# Patient Record
Sex: Male | Born: 1945 | Race: White | Hispanic: No | Marital: Married | Smoking: Never smoker
Health system: Southern US, Community
[De-identification: ages and names within clinical notes are randomized; demographics above are authoritative.]

## PROBLEM LIST (undated history)

## (undated) DIAGNOSIS — I10 Essential (primary) hypertension: Secondary | ICD-10-CM

## (undated) DIAGNOSIS — E119 Type 2 diabetes mellitus without complications: Secondary | ICD-10-CM

## (undated) DIAGNOSIS — E7800 Pure hypercholesterolemia, unspecified: Secondary | ICD-10-CM

## (undated) HISTORY — PX: SHOULDER SURGERY: SHX246

---

## 2019-09-07 ENCOUNTER — Encounter (HOSPITAL_BASED_OUTPATIENT_CLINIC_OR_DEPARTMENT_OTHER): Payer: Self-pay | Admitting: Emergency Medicine

## 2019-09-07 ENCOUNTER — Emergency Department (HOSPITAL_BASED_OUTPATIENT_CLINIC_OR_DEPARTMENT_OTHER): Payer: Medicare Other

## 2019-09-07 ENCOUNTER — Inpatient Hospital Stay (HOSPITAL_BASED_OUTPATIENT_CLINIC_OR_DEPARTMENT_OTHER)
Admission: EM | Admit: 2019-09-07 | Discharge: 2019-09-11 | DRG: 872 | Disposition: A | Discharge status: Home / Self Care | Payer: Medicare Other | Attending: Family Medicine | Admitting: Family Medicine

## 2019-09-07 ENCOUNTER — Other Ambulatory Visit: Payer: Self-pay

## 2019-09-07 DIAGNOSIS — I129 Hypertensive chronic kidney disease with stage 1 through stage 4 chronic kidney disease, or unspecified chronic kidney disease: Secondary | ICD-10-CM | POA: Diagnosis present

## 2019-09-07 DIAGNOSIS — K760 Fatty (change of) liver, not elsewhere classified: Secondary | ICD-10-CM | POA: Diagnosis present

## 2019-09-07 DIAGNOSIS — N179 Acute kidney failure, unspecified: Secondary | ICD-10-CM | POA: Diagnosis present

## 2019-09-07 DIAGNOSIS — N1831 Chronic kidney disease, stage 3a: Secondary | ICD-10-CM | POA: Diagnosis present

## 2019-09-07 DIAGNOSIS — A419 Sepsis, unspecified organism: Secondary | ICD-10-CM | POA: Diagnosis not present

## 2019-09-07 DIAGNOSIS — G2581 Restless legs syndrome: Secondary | ICD-10-CM | POA: Diagnosis present

## 2019-09-07 DIAGNOSIS — E8770 Fluid overload, unspecified: Secondary | ICD-10-CM | POA: Diagnosis present

## 2019-09-07 DIAGNOSIS — N401 Enlarged prostate with lower urinary tract symptoms: Secondary | ICD-10-CM | POA: Diagnosis present

## 2019-09-07 DIAGNOSIS — E1122 Type 2 diabetes mellitus with diabetic chronic kidney disease: Secondary | ICD-10-CM | POA: Diagnosis present

## 2019-09-07 DIAGNOSIS — Z6837 Body mass index (BMI) 37.0-37.9, adult: Secondary | ICD-10-CM

## 2019-09-07 DIAGNOSIS — R000 Tachycardia, unspecified: Secondary | ICD-10-CM | POA: Diagnosis present

## 2019-09-07 DIAGNOSIS — I350 Nonrheumatic aortic (valve) stenosis: Secondary | ICD-10-CM | POA: Diagnosis present

## 2019-09-07 DIAGNOSIS — E785 Hyperlipidemia, unspecified: Secondary | ICD-10-CM | POA: Diagnosis present

## 2019-09-07 DIAGNOSIS — R509 Fever, unspecified: Secondary | ICD-10-CM | POA: Diagnosis present

## 2019-09-07 DIAGNOSIS — E662 Morbid (severe) obesity with alveolar hypoventilation: Secondary | ICD-10-CM | POA: Diagnosis present

## 2019-09-07 DIAGNOSIS — K7460 Unspecified cirrhosis of liver: Secondary | ICD-10-CM

## 2019-09-07 DIAGNOSIS — R6520 Severe sepsis without septic shock: Secondary | ICD-10-CM

## 2019-09-07 DIAGNOSIS — N138 Other obstructive and reflux uropathy: Secondary | ICD-10-CM | POA: Diagnosis present

## 2019-09-07 DIAGNOSIS — N170 Acute kidney failure with tubular necrosis: Secondary | ICD-10-CM | POA: Diagnosis not present

## 2019-09-07 DIAGNOSIS — R6889 Other general symptoms and signs: Secondary | ICD-10-CM

## 2019-09-07 DIAGNOSIS — R6510 Systemic inflammatory response syndrome (SIRS) of non-infectious origin without acute organ dysfunction: Secondary | ICD-10-CM

## 2019-09-07 DIAGNOSIS — R0603 Acute respiratory distress: Secondary | ICD-10-CM

## 2019-09-07 DIAGNOSIS — Z20822 Contact with and (suspected) exposure to covid-19: Secondary | ICD-10-CM | POA: Diagnosis present

## 2019-09-07 HISTORY — DX: Pure hypercholesterolemia, unspecified: E78.00

## 2019-09-07 HISTORY — DX: Essential (primary) hypertension: I10

## 2019-09-07 HISTORY — DX: Type 2 diabetes mellitus without complications: E11.9

## 2019-09-07 LAB — URINALYSIS, ROUTINE W REFLEX MICROSCOPIC
Bilirubin Urine: NEGATIVE
Glucose, UA: NEGATIVE mg/dL
Hgb urine dipstick: NEGATIVE
Ketones, ur: NEGATIVE mg/dL
Leukocytes,Ua: NEGATIVE
Nitrite: NEGATIVE
Protein, ur: NEGATIVE mg/dL
Specific Gravity, Urine: <1.005 — ABNORMAL LOW (ref 1.005–1.030)
pH: 5.5 (ref 5.0–8.0)

## 2019-09-07 LAB — CBC
HCT: 39.2 % (ref 39.0–52.0)
Hemoglobin: 13.3 g/dL (ref 13.0–17.0)
MCH: 31.1 pg (ref 26.0–34.0)
MCHC: 33.9 g/dL (ref 30.0–36.0)
MCV: 91.6 fL (ref 80.0–100.0)
Platelets: 204 10*3/uL (ref 150–400)
RBC: 4.28 MIL/uL (ref 4.22–5.81)
RDW: 13.3 % (ref 11.5–15.5)
WBC: 20.6 10*3/uL — ABNORMAL HIGH (ref 4.0–10.5)
nRBC: 0 % (ref 0.0–0.2)

## 2019-09-07 LAB — CBC WITH DIFFERENTIAL/PLATELET
Abs Immature Granulocytes: 0.08 10*3/uL — ABNORMAL HIGH (ref 0.00–0.07)
Basophils Absolute: 0 10*3/uL (ref 0.0–0.1)
Basophils Relative: 0 %
Eosinophils Absolute: 0.2 10*3/uL (ref 0.0–0.5)
Eosinophils Relative: 1 %
HCT: 41.5 % (ref 39.0–52.0)
Hemoglobin: 13.8 g/dL (ref 13.0–17.0)
Immature Granulocytes: 1 %
Lymphocytes Relative: 10 %
Lymphs Abs: 1.1 10*3/uL (ref 0.7–4.0)
MCH: 31 pg (ref 26.0–34.0)
MCHC: 33.3 g/dL (ref 30.0–36.0)
MCV: 93.3 fL (ref 80.0–100.0)
Monocytes Absolute: 0.5 10*3/uL (ref 0.1–1.0)
Monocytes Relative: 5 %
Neutro Abs: 9.3 10*3/uL — ABNORMAL HIGH (ref 1.7–7.7)
Neutrophils Relative %: 83 %
Platelets: 204 10*3/uL (ref 150–400)
RBC: 4.45 MIL/uL (ref 4.22–5.81)
RDW: 12.9 % (ref 11.5–15.5)
WBC: 11.2 10*3/uL — ABNORMAL HIGH (ref 4.0–10.5)
nRBC: 0 % (ref 0.0–0.2)

## 2019-09-07 LAB — CREATININE, SERUM
Creatinine, Ser: 1.53 mg/dL — ABNORMAL HIGH (ref 0.61–1.24)
GFR calc Af Amer: 52 mL/min — ABNORMAL LOW (ref >60)
GFR calc non Af Amer: 45 mL/min — ABNORMAL LOW (ref >60)

## 2019-09-07 LAB — GLUCOSE, CAPILLARY
Glucose-Capillary: 124 mg/dL — ABNORMAL HIGH (ref 70–99)
Glucose-Capillary: 120 mg/dL — ABNORMAL HIGH (ref 70–99)

## 2019-09-07 LAB — PROTIME-INR
INR: 1.1 (ref 0.8–1.2)
Prothrombin Time: 13.2 seconds (ref 11.4–15.2)

## 2019-09-07 LAB — COMPREHENSIVE METABOLIC PANEL
ALT: 28 U/L (ref 0–44)
AST: 26 U/L (ref 15–41)
Albumin: 4 g/dL (ref 3.5–5.0)
Alkaline Phosphatase: 53 U/L (ref 38–126)
Anion gap: 10 (ref 5–15)
BUN: 30 mg/dL — ABNORMAL HIGH (ref 8–23)
CO2: 25 mmol/L (ref 22–32)
Calcium: 9.2 mg/dL (ref 8.9–10.3)
Chloride: 104 mmol/L (ref 98–111)
Creatinine, Ser: 1.67 mg/dL — ABNORMAL HIGH (ref 0.61–1.24)
GFR calc Af Amer: 47 mL/min — ABNORMAL LOW (ref >60)
GFR calc non Af Amer: 41 mL/min — ABNORMAL LOW (ref >60)
Glucose, Bld: 78 mg/dL (ref 70–99)
Potassium: 3.6 mmol/L (ref 3.5–5.1)
Sodium: 139 mmol/L (ref 135–145)
Total Bilirubin: 0.7 mg/dL (ref 0.3–1.2)
Total Protein: 7.5 g/dL (ref 6.5–8.1)

## 2019-09-07 LAB — LACTIC ACID, PLASMA: Lactic Acid, Venous: 1.2 mmol/L (ref 0.5–1.9)

## 2019-09-07 LAB — APTT: aPTT: 21 seconds — ABNORMAL LOW (ref 24–36)

## 2019-09-07 LAB — SARS CORONAVIRUS 2 BY RT PCR (HOSPITAL ORDER, PERFORMED IN ~~LOC~~ HOSPITAL LAB): SARS Coronavirus 2: NEGATIVE

## 2019-09-07 MED ORDER — ENOXAPARIN SODIUM 40 MG/0.4ML ~~LOC~~ SOLN
40.0000 mg | SUBCUTANEOUS | Status: DC
  Administered 2019-09-08 – 2019-09-11 (×4): 40 mg via SUBCUTANEOUS
  Filled 2019-09-07 (×4): qty 0.4

## 2019-09-07 MED ORDER — VANCOMYCIN HCL 1250 MG/250ML IV SOLN
1250.0000 mg | INTRAVENOUS | Status: DC
  Administered 2019-09-08 – 2019-09-09 (×2): 1250 mg via INTRAVENOUS
  Filled 2019-09-07 (×3): qty 250

## 2019-09-07 MED ORDER — SODIUM CHLORIDE 0.9 % IV SOLN
2.0000 g | Freq: Two times a day (BID) | INTRAVENOUS | Status: DC
  Administered 2019-09-07 – 2019-09-10 (×6): 2 g via INTRAVENOUS
  Filled 2019-09-07 (×6): qty 2

## 2019-09-07 MED ORDER — METRONIDAZOLE IN NACL 5-0.79 MG/ML-% IV SOLN
500.0000 mg | Freq: Three times a day (TID) | INTRAVENOUS | Status: DC
  Administered 2019-09-07 – 2019-09-10 (×9): 500 mg via INTRAVENOUS
  Filled 2019-09-07 (×9): qty 100

## 2019-09-07 MED ORDER — VANCOMYCIN HCL IN DEXTROSE 1-5 GM/200ML-% IV SOLN
INTRAVENOUS | Status: AC
  Administered 2019-09-07: 1000 mg via INTRAVENOUS
  Filled 2019-09-07: qty 400

## 2019-09-07 MED ORDER — ACETAMINOPHEN 325 MG PO TABS
650.0000 mg | ORAL_TABLET | Freq: Once | ORAL | Status: AC
  Administered 2019-09-07: 650 mg via ORAL
  Filled 2019-09-07: qty 2

## 2019-09-07 MED ORDER — SODIUM CHLORIDE 0.9 % IV BOLUS (SEPSIS)
1000.0000 mL | Freq: Once | INTRAVENOUS | Status: AC
  Administered 2019-09-07: 1000 mL via INTRAVENOUS

## 2019-09-07 MED ORDER — SODIUM CHLORIDE 0.9 % IV SOLN: INTRAVENOUS | Status: DC

## 2019-09-07 MED ORDER — VANCOMYCIN HCL IN DEXTROSE 1-5 GM/200ML-% IV SOLN: 1000.0000 mg | Freq: Once | INTRAVENOUS | Status: AC

## 2019-09-07 MED ORDER — MELATONIN 3 MG PO TABS
6.0000 mg | ORAL_TABLET | Freq: Every evening | ORAL | Status: DC | PRN
  Administered 2019-09-07 – 2019-09-10 (×4): 6 mg via ORAL
  Filled 2019-09-07 (×4): qty 2

## 2019-09-07 MED ORDER — INSULIN ASPART PROT & ASPART (70-30 MIX) 100 UNIT/ML ~~LOC~~ SUSP
35.0000 [IU] | Freq: Two times a day (BID) | SUBCUTANEOUS | Status: DC
  Administered 2019-09-08 – 2019-09-10 (×5): 35 [IU] via SUBCUTANEOUS
  Filled 2019-09-07: qty 10

## 2019-09-07 MED ORDER — VANCOMYCIN HCL 2000 MG/400ML IV SOLN
2000.0000 mg | Freq: Once | INTRAVENOUS | Status: DC
  Filled 2019-09-07: qty 400

## 2019-09-07 MED ORDER — SODIUM CHLORIDE 0.9 % IV SOLN: 2.0000 g | Freq: Once | INTRAVENOUS | Status: DC

## 2019-09-07 MED ORDER — VANCOMYCIN HCL IN DEXTROSE 1-5 GM/200ML-% IV SOLN: 1000.0000 mg | Freq: Once | INTRAVENOUS | Status: DC

## 2019-09-07 MED ORDER — SODIUM CHLORIDE 0.9 % IV BOLUS (SEPSIS)
200.0000 mL | Freq: Once | INTRAVENOUS | Status: AC
  Administered 2019-09-07: 200 mL via INTRAVENOUS

## 2019-09-07 MED ORDER — ENOXAPARIN SODIUM 30 MG/0.3ML ~~LOC~~ SOLN
30.0000 mg | SUBCUTANEOUS | Status: DC
  Administered 2019-09-07: 30 mg via SUBCUTANEOUS
  Filled 2019-09-07: qty 0.3

## 2019-09-07 MED ORDER — VANCOMYCIN HCL IN DEXTROSE 1-5 GM/200ML-% IV SOLN
1000.0000 mg | Freq: Once | INTRAVENOUS | Status: DC
Start: 2019-09-07 — End: 2019-09-07

## 2019-09-07 MED ORDER — VANCOMYCIN HCL IN DEXTROSE 1-5 GM/200ML-% IV SOLN
1000.0000 mg | Freq: Once | INTRAVENOUS | Status: AC
  Administered 2019-09-07: 1000 mg via INTRAVENOUS

## 2019-09-07 MED ORDER — MELATONIN 3 MG PO TABS: 6.0000 mg | ORAL_TABLET | Freq: Every day | ORAL | Status: DC

## 2019-09-07 MED ORDER — METRONIDAZOLE IN NACL 5-0.79 MG/ML-% IV SOLN
500.0000 mg | Freq: Once | INTRAVENOUS | Status: AC
  Administered 2019-09-07: 500 mg via INTRAVENOUS
  Filled 2019-09-07: qty 100

## 2019-09-07 MED ORDER — SODIUM CHLORIDE 0.9 % IV SOLN
2.0000 g | Freq: Once | INTRAVENOUS | Status: AC
  Administered 2019-09-07: 2 g via INTRAVENOUS
  Filled 2019-09-07: qty 2

## 2019-09-07 MED ORDER — ROPINIROLE HCL 1 MG PO TABS
6.0000 mg | ORAL_TABLET | Freq: Every day | ORAL | Status: DC
  Administered 2019-09-07 – 2019-09-10 (×4): 6 mg via ORAL
  Filled 2019-09-07 (×5): qty 6

## 2019-09-07 MED ORDER — IOHEXOL 300 MG/ML  SOLN
100.0000 mL | Freq: Once | INTRAMUSCULAR | Status: AC | PRN
  Administered 2019-09-07: 80 mL via INTRAVENOUS

## 2019-09-07 NOTE — Progress Notes (Signed)
Pt. placed on CPAP @ this time, tolerating well. 

## 2019-09-07 NOTE — ED Provider Notes (Signed)
Home splints [facility] EMERGENCY DEPARTMENT Provider Note   CSN: [id number] Arrival date & time: 09/07/19  2694     History Chief Complaint  Patient presents with   Fever    Ronald Cooley is a 74 y.o. male.  He is complaining of acute onset of chills starting around 3 AM this morning.  Felt cold.  Daughter said that her mother thought the patient was a little confused.  Patient denies.  He has no headache sore throat cough vomiting diarrhea or urinary symptoms.  Does endorse nausea.  No sick contacts or recent travel.  No new rashes or wounds.  No swollen joints.  Had his Covid vaccine.  Daughter states that he was weak and she had to assist with ambulation.  The history is provided by the patient and a relative.  Fever Max temp prior to arrival:  102.6 Temp source:  Oral Severity:  Moderate Onset quality:  Sudden Timing:  Constant Progression:  Unchanged Chronicity:  New Relieved by:  None tried Worsened by:  Nothing Ineffective treatments:  None tried Associated symptoms: chills, confusion and nausea   Associated symptoms: no chest pain, no cough, no diarrhea, no dysuria, no headaches, no myalgias, no rash, no rhinorrhea, no sore throat and no vomiting   Risk factors: no hx of cancer, no recent sickness, no recent travel and no sick contacts        Past Medical History:  Diagnosis Date   Diabetes mellitus without complication (HCC)    High cholesterol    Hypertension     There are no problems to display for this patient.   Past Surgical History:  Procedure Laterality Date   SHOULDER SURGERY         No family history on file.  Social History   Tobacco Use   Smoking status: Never Smoker   Smokeless tobacco: Never Used  Substance Use Topics   Alcohol use: Never   Drug use: Never    Home Medications Prior to Admission medications   Not on File    Allergies    Patient has no known allergies.  Review of Systems   Review of  Systems  Constitutional: Positive for chills and fever.  HENT: Negative for rhinorrhea and sore throat.   Eyes: Negative for visual disturbance.  Respiratory: Negative for cough and shortness of breath.   Cardiovascular: Negative for chest pain.  Gastrointestinal: Positive for nausea. Negative for abdominal pain, diarrhea and vomiting.  Genitourinary: Negative for dysuria.  Musculoskeletal: Positive for gait problem. Negative for myalgias.  Skin: Negative for rash.  Neurological: Negative for headaches.  Psychiatric/Behavioral: Positive for confusion.    Physical Exam Updated Vital Signs BP 123/71 (BP Location: Right Arm)    Pulse (!) 125    Temp (!) 102.6 F (39.2 C) (Oral)    Resp 20    Ht 5\' 8"  (1.727 m)    Wt 111.1 kg    SpO2 93%    BMI 37.25 kg/m   Physical Exam Vitals and nursing note reviewed.  Constitutional:      Appearance: Normal appearance. He is well-developed.  HENT:     Head: Normocephalic and atraumatic.  Eyes:     Conjunctiva/sclera: Conjunctivae normal.  Cardiovascular:     Rate and Rhythm: Regular rhythm. Tachycardia present.     Heart sounds: Murmur heard.   Pulmonary:     Effort: Pulmonary effort is normal. No respiratory distress.     Breath sounds: Normal breath sounds.  Abdominal:  Palpations: Abdomen is soft.     Tenderness: There is no abdominal tenderness.  Musculoskeletal:        General: No signs of injury. Normal range of motion.     Cervical back: Neck supple.     Right lower leg: Edema present.     Left lower leg: Edema present.  Skin:    General: Skin is warm and dry.     Capillary Refill: Capillary refill takes less than 2 seconds.  Neurological:     General: No focal deficit present.     Mental Status: He is alert and oriented to person, place, and time.     ED Results / Procedures / Treatments   Labs (all labs ordered are listed, but only abnormal results are displayed) Labs Reviewed  COMPREHENSIVE METABOLIC PANEL -  Abnormal; Notable for the following components:      Result Value   BUN 30 (*)    Creatinine, Ser 1.67 (*)    GFR calc non Af Amer 41 (*)    GFR calc Af Amer 47 (*)    All other components within normal limits  CBC WITH DIFFERENTIAL/PLATELET - Abnormal; Notable for the following components:   WBC 11.2 (*)    Neutro Abs 9.3 (*)    Abs Immature Granulocytes 0.08 (*)    All other components within normal limits  APTT - Abnormal; Notable for the following components:   aPTT 21 (*)    All other components within normal limits  URINALYSIS, ROUTINE W REFLEX MICROSCOPIC - Abnormal; Notable for the following components:   Specific Gravity, Urine <1.005 (*)    All other components within normal limits  CBC - Abnormal; Notable for the following components:   WBC 20.6 (*)    All other components within normal limits  CREATININE, SERUM - Abnormal; Notable for the following components:   Creatinine, Ser 1.53 (*)    GFR calc non Af Amer 45 (*)    GFR calc Af Amer 52 (*)    All other components within normal limits  GLUCOSE, CAPILLARY - Abnormal; Notable for the following components:   Glucose-Capillary 124 (*)    All other components within normal limits  SARS CORONAVIRUS 2 BY RT PCR (HOSPITAL ORDER, PERFORMED IN [facility])  CULTURE, BLOOD (ROUTINE X 2)  CULTURE, BLOOD (ROUTINE X 2)  URINE CULTURE  LACTIC ACID, PLASMA  PROTIME-INR  LACTIC ACID, PLASMA  PROTIME-INR  CORTISOL-AM, BLOOD  PROCALCITONIN  COMPREHENSIVE METABOLIC PANEL  CBC    EKG EKG Interpretation  Date/Time:  Wednesday September 07 2019 07:51:40 EDT Ventricular Rate:  120 PR Interval:    QRS Duration: 95 QT Interval:  300 QTC Calculation: 424 R Axis:   5 Text Interpretation: Sinus tachycardia Borderline ST elevation, anterior leads No old tracing to compare Confirmed by Meridee ScoreButler, Marya Lowden [phone] on 09/07/2019 7:52:52 AM   Radiology CT Chest W Contrast  Result Date: 09/07/2019 CLINICAL DATA:  Abdominal  abscess or infection suspected. Fever and weakness EXAM: CT CHEST, ABDOMEN, AND PELVIS WITH CONTRAST TECHNIQUE: Multidetector CT imaging of the chest, abdomen and pelvis was performed following the standard protocol during bolus administration of intravenous contrast. CONTRAST:  80mL OMNIPAQUE IOHEXOL 300 MG/ML  SOLN COMPARISON:  None. FINDINGS: CT CHEST FINDINGS Cardiovascular: Normal heart size. No pericardial effusion. Aortic and coronary atherosclerosis. Aortic valvular calcification. No acute vascular finding. Mediastinum/Nodes: Negative for adenopathy. Lungs/Pleura: There is no edema, consolidation, effusion, or pneumothorax. Musculoskeletal: Extensive disc degeneration. There are levels of  endplate irregularity that is multilevel and degenerative appearing. No paravertebral inflammatory soft tissue findings. Postoperative right shoulder with rotator cuff atrophy. CT ABDOMEN PELVIS FINDINGS Hepatobiliary: No focal liver abnormality.No evidence of biliary obstruction or stone. Pancreas: Unremarkable. Spleen: Unremarkable. Adrenals/Urinary Tract: Negative adrenals. No hydronephrosis or stone. Unremarkable bladder. Stomach/Bowel:  No obstruction. No appendicitis. Vascular/Lymphatic: No acute vascular abnormality. Extensive atherosclerotic calcification. No mass or adenopathy. Reproductive:No pathologic findings. Other: No ascites or pneumoperitoneum. Musculoskeletal: No acute abnormalities. Advanced lumbar spine degeneration with L4-5 anterolisthesis and high-grade canal/right foraminal stenosis. No evidence of bony infection. IMPRESSION: 1. No explanation for fever. No acute finding in the chest or abdomen. 2.  Aortic Atherosclerosis (ICD10-I70.0). Electronically Signed   By: Marnee Spring M.D.   On: 09/07/2019 09:25   CT Abdomen Pelvis W Contrast  Result Date: 09/07/2019 CLINICAL DATA:  Abdominal abscess or infection suspected. Fever and weakness EXAM: CT CHEST, ABDOMEN, AND PELVIS WITH CONTRAST  TECHNIQUE: Multidetector CT imaging of the chest, abdomen and pelvis was performed following the standard protocol during bolus administration of intravenous contrast. CONTRAST:  80mL OMNIPAQUE IOHEXOL 300 MG/ML  SOLN COMPARISON:  None. FINDINGS: CT CHEST FINDINGS Cardiovascular: Normal heart size. No pericardial effusion. Aortic and coronary atherosclerosis. Aortic valvular calcification. No acute vascular finding. Mediastinum/Nodes: Negative for adenopathy. Lungs/Pleura: There is no edema, consolidation, effusion, or pneumothorax. Musculoskeletal: Extensive disc degeneration. There are levels of endplate irregularity that is multilevel and degenerative appearing. No paravertebral inflammatory soft tissue findings. Postoperative right shoulder with rotator cuff atrophy. CT ABDOMEN PELVIS FINDINGS Hepatobiliary: No focal liver abnormality.No evidence of biliary obstruction or stone. Pancreas: Unremarkable. Spleen: Unremarkable. Adrenals/Urinary Tract: Negative adrenals. No hydronephrosis or stone. Unremarkable bladder. Stomach/Bowel:  No obstruction. No appendicitis. Vascular/Lymphatic: No acute vascular abnormality. Extensive atherosclerotic calcification. No mass or adenopathy. Reproductive:No pathologic findings. Other: No ascites or pneumoperitoneum. Musculoskeletal: No acute abnormalities. Advanced lumbar spine degeneration with L4-5 anterolisthesis and high-grade canal/right foraminal stenosis. No evidence of bony infection. IMPRESSION: 1. No explanation for fever. No acute finding in the chest or abdomen. 2.  Aortic Atherosclerosis (ICD10-I70.0). Electronically Signed   By: Marnee Spring M.D.   On: 09/07/2019 09:25   DG Chest Port 1 View  Result Date: 09/07/2019 CLINICAL DATA:  Fever and rigors EXAM: PORTABLE CHEST 1 VIEW COMPARISON:  None. FINDINGS: Normal heart size and mediastinal contours. Low volume chest without acute infiltrate or edema. No effusion or pneumothorax. No acute osseous findings.  IMPRESSION: No visible pneumonia. Electronically Signed   By: Marnee Spring M.D.   On: 09/07/2019 07:48    Procedures .Critical Care Performed by: Terrilee Files, MD Authorized by: Terrilee Files, MD   Critical care provider statement:    Critical care time (minutes):  45   Critical care time was exclusive of:  Separately billable procedures and treating other patients   Critical care was necessary to treat or prevent imminent or life-threatening deterioration of the following conditions:  Sepsis   Critical care was time spent personally by me on the following activities:  Discussions with consultants, evaluation of patient's response to treatment, examination of patient, ordering and performing treatments and interventions, ordering and review of laboratory studies, ordering and review of radiographic studies, pulse oximetry, re-evaluation of patient's condition, obtaining history from patient or surrogate, review of old charts and development of treatment plan with patient or surrogate   I assumed direction of critical care for this patient from another provider in my specialty: no     (including critical care time)  Medications Ordered in ED Medications  ceFEPIme (MAXIPIME) 2 g in sodium chloride 0.9 % 100 mL IVPB (has no administration in time range)  vancomycin (VANCOREADY) IVPB 1250 mg/250 mL (has no administration in time range)  0.9 %  sodium chloride infusion ( Intravenous New Bag/Given 09/07/19 1928)  enoxaparin (LOVENOX) injection 30 mg (has no administration in time range)  metroNIDAZOLE (FLAGYL) IVPB 500 mg (500 mg Intravenous New Bag/Given 09/07/19 1829)  insulin aspart protamine- aspart (NOVOLOG MIX 70/30) injection 35 Units (has no administration in time range)  sodium chloride 0.9 % bolus 1,000 mL (0 mLs Intravenous Stopped 09/07/19 0806)    And  sodium chloride 0.9 % bolus 1,000 mL (0 mLs Intravenous Stopped 09/07/19 0829)    And  sodium chloride 0.9 % bolus 200 mL  (0 mLs Intravenous Stopped 09/07/19 0902)  acetaminophen (TYLENOL) tablet 650 mg (650 mg Oral Given 09/07/19 0733)  iohexol (OMNIPAQUE) 300 MG/ML solution 100 mL (80 mLs Intravenous Contrast Given 09/07/19 0837)  ceFEPIme (MAXIPIME) 2 g in sodium chloride 0.9 % 100 mL IVPB (0 g Intravenous Stopped 09/07/19 1009)  metroNIDAZOLE (FLAGYL) IVPB 500 mg (0 mg Intravenous Stopped 09/07/19 1047)  vancomycin (VANCOCIN) IVPB 1000 mg/200 mL premix (0 mg Intravenous Stopped 09/07/19 1132)    And  vancomycin (VANCOCIN) IVPB 1000 mg/200 mL premix (0 mg Intravenous Stopped 09/07/19 1128)    ED Course  I have reviewed the triage vital signs and the nursing notes.  Pertinent labs & imaging results that were available during my care of the patient were reviewed by me and considered in my medical decision making (see chart for details).  Clinical Course as of Sep 07 1934  Wed Sep 07, 2019  7846 Chest x-ray interpreted by me as no gross infiltrates.   [MB]  0802 Initial lactate not elevated.   [MB]  0944 No source identified.  Empiric antibiotics ordered.  Discussed with [facility] hospitalist for admission.  Patient and daughter in agreement for admission for further work-up of fever.   [MB]    Clinical Course User Index [MB] Terrilee Files, MD   MDM Rules/Calculators/A&P                         Holdyn Poyser was evaluated in Emergency Department on 09/07/2019 for the symptoms described in the history of present illness. He was evaluated in the context of the global COVID-19 pandemic, which necessitated consideration that the patient might be at risk for infection with the SARS-CoV-2 virus that causes COVID-19. Institutional protocols and algorithms that pertain to the evaluation of patients at risk for COVID-19 are in a state of rapid change based on information released by regulatory bodies including the CDC and federal and state organizations. These policies and algorithms were followed during the patient's  care in the ED.  This patient complains of shaking chills; this involves an extensive number of treatment Options and is a complaint that carries with it a high risk of complications and Morbidity. The differential includes sepsis, Sirs, pneumonia, UTI, intra-abdominal abscess, covid.   I ordered, reviewed and interpreted labs, which included CBC with elevated white count, normal hemoglobin, chemistries with elevated creatinine unclear baseline, Covid testing negative, urinalysis negative, lactate not elevated at 1.2 I ordered medication IV fluids for sepsis, empiric antibiotics Tylenol I ordered imaging studies which included chest x-ray, CT chest and abdomen and pelvis and I independently    visualized and interpreted imaging which showed no acute findings  Additional history obtained from patient's daughter Previous records obtained and reviewed in epic, no recent admissions I consulted [facility] hospitalist and discussed lab and imaging findings  Critical Interventions: Identification of the management of SIRS/sepsis.  Patient will be admitted to the hospital for continued antibiotics monitoring of blood cultures.  After the interventions stated above, I reevaluated the patient and found patient to be hemodynamically stable, has defervesced a little bit but is still tachycardic.  Blood pressures remained stable.  Will need to be transferred to [facility] long campus for admission.   Final Clinical Impression(s) / ED Diagnoses Final diagnoses:  Fever, unspecified  Rigors  SIRS (systemic inflammatory response syndrome) (HCC)    Rx / DC Orders ED Discharge Orders    None       Terrilee Files, MD 09/07/19 1945

## 2019-09-07 NOTE — H&P (Signed)
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 No ill contacts but is not sure if the place he was at had any contamination No exposures to [employer] me that he has trouble with his urinary stream since today but has always had some issues also tells me that he has occasional sciatic pain but no other swollen joints or swellings [facility]-found to have high fever 103-started on empiric antibiotic coverage given a bolus of fluids and discussed admission  Review of Systems:    Pertinent +'s: + Tinnitus, + weak urinary stream, + some nausea this morning Pertinent -"s: Cough, cold, ear pain, sore throat, neck stiffness, abdominal pain, joint swelling, vomit, dark stool, tarry stool, unilateral weakness, strokelike symptoms, seizure, rash, wound or sore,  ED Course: Broad-spectrum antibiotics given, bolus of fluids, Tylenol given, kept n.p.o.   Past Medical History:  Diagnosis Date  . Diabetes mellitus without complication (HCC)   . High cholesterol   . Hypertension    Past Surgical History:  Procedure Laterality Date  . SHOULDER SURGERY      reports that he has never smoked. He has never used smokeless tobacco. He reports that he does not drink alcohol and does not use drugs. 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 Prior to Admission medications   Not on File  Father and mother passed away of heart failure  Physical Exam:  Vitals:   09/07/19 1500 09/07/19 1615  BP: 140/63 139/67  Pulse: (!) 110 (!) 112  Resp: 19 16  Temp:  (!) 100.9 F (38.3 C)  SpO2: 99% 96%     Thick neck Mallampati 4 moderate dentition no overt caries  No sinus pressure  Deferred auricular exam  Neck soft supple no submandibular lymphadenopathy  Sinus tach on exam rate of about 100 holosystolic murmur no accentuation with provocative maneuvers  Chest clear no added sound no egophony no TVF  Abdomen soft obese no right upper quadrant tenderness nor organomegaly no rebound-rectal deferred  No lower extremity edema rash  Euthymic congruent  Power 5/5 finger-nose-finger intact sensory grossly intact and detailed exam deferred  Negative Kernig's negative Brudzinski sign  I have personally reviewed following labs and imaging studies  Labs:  BUN/creatinine 30/1.67 lactic acid 1.2 WBC 11.2 urine analysis negative  Blood culture extra obtained urine culture obtained  Portable CXR no pneumonia   Medical tests:   EKG independently reviewed: Sinus tachycardia PR interval 0.04 QRS axis V mild ST-T wave changes that seem to be rate related I have no other EKG to compare with previously  Test discussed with performing physician:  No  Decision to obtain old records:   I have  extensively looked at his [facility] database  Review and summation of old records:   I have summarized  Active Problems:   Sepsis (HCC)   Assessment/Plan  Sepsis query cause\  Received bolus of fluids at [facility]  Continue 125 cc/h rate Cycle lactic acid Continue vancomycin cefepime and Flagyl for undifferentiated sepsis and can narrow rapidly Follow blood and urine cultures obtained  6/30 Allow diet Keep on monitors given tachycardia Doubt that this is meningitis-patient has negative physical signs for the same and does not have high risk based on my stratification  Morbid obesity OSA Will order CPAP Needs outpatient weight loss counseling  DM TY 2 with nephropathy Meds have not been reconciled--chart review it appears he may be taking 70/30 insulin 56 twice daily-I will start him on 35 units for now We will attempt to do the same but I will place on sliding scale at this time I will hold his glipizide 10-this is a relatively risky drug in patients over 65 and he may benefit from glyburide instead Can continue gabapentin 1 AM and 2 PM if this is reconciled correctly in the system Labs a.m.  Probable prostatism Bladder scan stat Covering with broad-spectrum antibiotics Would start Flomax given his history of dribbling small amounts of urine today-I suspect his source of infection is urinary  Aortic stenosis Murmur is impressive-he can follow-up with his outpatient [facility] cardiologist We will resume amlodipine 5 mg lisinopril will be cut back from 40 to 10 mg once this is reconciled and I will hold his HCTZ given the risk of him having AKI from these meds  ?  AKI No specific baseline available    Severity of Illness: The appropriate patient status for this patient is INPATIENT. Inpatient status is judged to be reasonable and necessary in order to provide the required intensity of service to ensure the patient's safety. The patient's presenting symptoms, physical exam findings, and initial radiographic and laboratory data in the context of their chronic comorbidities is felt to place them at high risk for further clinical deterioration. Furthermore, it is not anticipated that the patient will be medically stable for discharge from the hospital within 2 midnights of admission. The following factors support the patient status of inpatient.   " The patient's presenting  symptoms include fever chills tachycardia. " The worrisome physical exam findings include sweating warm to touch. " The initial radiographic and laboratory data are worrisome because of sepsis. " The chronic co-morbidities include diabetes mellitus type 2.   * I certify that at the point of admission it is my clinical judgment that the patient will require inpatient hospital care spanning beyond 2 midnights from the point of admission due to high intensity of service, high risk for further deterioration and high frequency of surveillance required.*   DVT prophylaxis: Lovenox Code Status: Full code Family Communication: No family Consults called: None  Time spent: 85 minutes  Mahala Menghini, MD [days-call my NP partners at night for Care related issues] [facility] --[employer] [employer] [place] , [email or web address]; password [facility]  09/07/2019, 4:47 PM

## 2019-09-07 NOTE — ED Triage Notes (Signed)
"  Teeth rattling chills" this morning.  Weakness. Denies vomiting or diarrhea but has been nauseated.

## 2019-09-07 NOTE — Progress Notes (Signed)
Pharmacy Antibiotic Note  Ronald Cooley is a 74 y.o. male admitted on 09/07/2019 with fever of unknown origin.  Pharmacy has been consulted for vancomycin/cefepime dosing. Also with Flagyl ordered x 1 dose per EDP. Tmax/24h 102.6, WBC 11.2, LA 1.2. SCr 1.67 on admit.  Plan: Cefepime 2g IV q12h Vancomycin 2g IV x 1; then 1250mg  IV q24h Flagyl 500mg  IV x 1 per EDP Monitor clinical progress, c/s, renal function F/u de-escalation plan/LOT, vancomycin levels as indicated   Height: 5\' 8"  (172.7 cm) Weight: 111.1 kg (245 lb) IBW/kg (Calculated) : 68.4  Temp (24hrs), Avg:100.7 F (38.2 C), Min:98.7 F (37.1 C), Max:102.6 F (39.2 C)  Recent Labs  Lab 09/07/19 0728  WBC 11.2*  CREATININE 1.67*  LATICACIDVEN 1.2    Estimated Creatinine Clearance: 49.1 mL/min (A) (by C-G formula based on SCr of 1.67 mg/dL (H)).    No Known Allergies   , PharmD, BCPS Please check AMION for all [facility] contact numbers Clinical Pharmacist 09/07/2019 9:56 AM

## 2019-09-07 NOTE — ED Notes (Signed)
ED Provider at bedside. 

## 2019-09-07 NOTE — Progress Notes (Signed)
Bladder scan performed, volume 0 x2.  Ardyth Gal, RN 09/07/2019

## 2019-09-08 ENCOUNTER — Inpatient Hospital Stay (HOSPITAL_COMMUNITY): Payer: Medicare Other

## 2019-09-08 DIAGNOSIS — R509 Fever, unspecified: Secondary | ICD-10-CM

## 2019-09-08 LAB — COMPREHENSIVE METABOLIC PANEL
ALT: 26 U/L (ref 0–44)
AST: 24 U/L (ref 15–41)
Albumin: 3.2 g/dL — ABNORMAL LOW (ref 3.5–5.0)
Alkaline Phosphatase: 39 U/L (ref 38–126)
Anion gap: 7 (ref 5–15)
BUN: 23 mg/dL (ref 8–23)
CO2: 22 mmol/L (ref 22–32)
Calcium: 8.2 mg/dL — ABNORMAL LOW (ref 8.9–10.3)
Chloride: 105 mmol/L (ref 98–111)
Creatinine, Ser: 1.49 mg/dL — ABNORMAL HIGH (ref 0.61–1.24)
GFR calc Af Amer: 54 mL/min — ABNORMAL LOW (ref >60)
GFR calc non Af Amer: 47 mL/min — ABNORMAL LOW (ref >60)
Glucose, Bld: 131 mg/dL — ABNORMAL HIGH (ref 70–99)
Potassium: 3.9 mmol/L (ref 3.5–5.1)
Sodium: 134 mmol/L — ABNORMAL LOW (ref 135–145)
Total Bilirubin: 0.8 mg/dL (ref 0.3–1.2)
Total Protein: 6.6 g/dL (ref 6.5–8.1)

## 2019-09-08 LAB — GLUCOSE, CAPILLARY
Glucose-Capillary: 138 mg/dL — ABNORMAL HIGH (ref 70–99)
Glucose-Capillary: 138 mg/dL — ABNORMAL HIGH (ref 70–99)
Glucose-Capillary: 155 mg/dL — ABNORMAL HIGH (ref 70–99)
Glucose-Capillary: 191 mg/dL — ABNORMAL HIGH (ref 70–99)
Glucose-Capillary: 237 mg/dL — ABNORMAL HIGH (ref 70–99)
Glucose-Capillary: 184 mg/dL — ABNORMAL HIGH (ref 70–99)

## 2019-09-08 LAB — CBC
HCT: 36.4 % — ABNORMAL LOW (ref 39.0–52.0)
Hemoglobin: 12.4 g/dL — ABNORMAL LOW (ref 13.0–17.0)
MCH: 31.7 pg (ref 26.0–34.0)
MCHC: 34.1 g/dL (ref 30.0–36.0)
MCV: 93.1 fL (ref 80.0–100.0)
Platelets: 215 10*3/uL (ref 150–400)
RBC: 3.91 MIL/uL — ABNORMAL LOW (ref 4.22–5.81)
RDW: 13.5 % (ref 11.5–15.5)
WBC: 14.1 10*3/uL — ABNORMAL HIGH (ref 4.0–10.5)
nRBC: 0 % (ref 0.0–0.2)

## 2019-09-08 LAB — URINE CULTURE

## 2019-09-08 LAB — PROCALCITONIN: Procalcitonin: 7.62 ng/mL

## 2019-09-08 LAB — CORTISOL-AM, BLOOD: Cortisol - AM: 21.6 ug/dL (ref 6.7–22.6)

## 2019-09-08 LAB — PROTIME-INR
INR: 1.1 (ref 0.8–1.2)
Prothrombin Time: 14.1 seconds (ref 11.4–15.2)

## 2019-09-08 MED ORDER — ACETAMINOPHEN 500 MG PO TABS
500.0000 mg | ORAL_TABLET | Freq: Four times a day (QID) | ORAL | Status: DC | PRN
  Administered 2019-09-08: 500 mg via ORAL
  Filled 2019-09-08: qty 1

## 2019-09-08 MED ORDER — FUROSEMIDE 10 MG/ML IJ SOLN: 40.0000 mg | Freq: Once | INTRAMUSCULAR | Status: AC

## 2019-09-08 MED ORDER — TAMSULOSIN HCL 0.4 MG PO CAPS
0.4000 mg | ORAL_CAPSULE | Freq: Every day | ORAL | Status: DC
  Administered 2019-09-08 – 2019-09-11 (×4): 0.4 mg via ORAL
  Filled 2019-09-08 (×4): qty 1

## 2019-09-08 MED ORDER — CHLORHEXIDINE GLUCONATE CLOTH 2 % EX PADS
6.0000 | MEDICATED_PAD | Freq: Every day | CUTANEOUS | Status: DC
  Administered 2019-09-08 – 2019-09-10 (×3): 6 via TOPICAL

## 2019-09-08 MED ORDER — FUROSEMIDE 10 MG/ML IJ SOLN
INTRAMUSCULAR | Status: AC
  Administered 2019-09-08: 40 mg via INTRAVENOUS
  Filled 2019-09-08: qty 4

## 2019-09-08 NOTE — Significant Event (Signed)
Rapid Response Event Note  Overview: Time Called: 0950 Arrival Time: 0953 Event Type: Respiratory  Initial Focused Assessment:  Called to room due to new onset shortness of breath. Patient has bilateral lower leg edema. Slight coarse crackles heard on auscultation. Per chart patient 4.5L positive. Patient reports onset was with activity, has chills, tachypnea, and reported hypoxia on room air. SPO with good pleth 98 on 3L [place].Chest X-ray ordered. Patient warm wuith axillary temperature of 100.6. MD paged. Fluids turned off until MD assessment. MD at bedside orders received for IV lasix. Patient report making urine, but in small amounts. Patient reports with rest the exacerbation has improved. Shaking has stopped. O2 turned off with good O2 95%. HR decreasing. BP stable. Plan to remain on telemetry. Bladder scan to be completed. Plan to administer tylenol if febrile over 101.5. Please call for any additional needs!  Interventions:  Chest Xray IV lasix Bladder Scan  Event Summary: Name of Physician Notified: Marikay Alar at [phone]    at    Outcome: Stayed in room and stabalized  Event End Time: 1015  Terry Abila A Lenae Wherley

## 2019-09-08 NOTE — Progress Notes (Signed)
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 Tachycardia hypoxic respiratory failure a. 2/2 volume overload, 4.5 L positive b. Poor urine output in addition c. Feels combination of fluid overload with poor output from likely BPH  d. over read of CXR shows essentially fluid in the lateral lung fields and we will treat this with some Lasix 2. Sepsis a. Continue empiric antibiotic coverage until cultures result--NGTD urine/blood b. Rpt BC x 1 today as Tmax >100.5 c. Suspect source is urinary given poor urine output despite 4.5 L positive and BPH 3. AKI on admission a. Stop fluids that were started on admission b. See above discussion 4. Likely BPH a. Foley catheter placed this a.m. b. Start Flomax this a.m. c. At this time has had 2.2 L out after placement of Foley catheter 5. Volume overload 6. Mild aortic stenosis a.  7. DM TY 2 with nephropathy a. 70/30 insulin 35 units down from home dose of 50s b. Good control 130s to 150s   c. Continue gabapentin,  glyburide 8. Morbid obesity with OHSS a. Tolerating CPAP at night-monitor trends   DVT prophylaxis:  Code Status: full Family Communication: called daughter Maralyn Sago [phone]--DJ answer Larrie Kass Disposition:   Status is: Inpatient  Remains inpatient appropriate because:Persistent severe electrolyte disturbances, IV treatments appropriate due to intensity of illness or inability to take PO and Inpatient level of care appropriate due to severity of illness   Dispo: The patient is from: Home              Anticipated d/c is to: Home              Anticipated d/c date is: 3 days              Patient currently is not medically stable to d/c.       Consultants:   n  Procedures: n  Antimicrobials: vanc, cefepime flagyll    Subjective: Notified by rapid response patient was tachycardic into the 140s and that his sat had dropped When I arrived in the room he tells me he peed "2 teaspoons" since early this morning Bladder scan at the bedside showed >999 He feels much better when I reviewed him at around 1030 subsequent to Foley placement and put out over 2.2 L  Objective: Vitals:   09/07/19 1940 09/07/19 2353 09/08/19 0425 09/08/19 0915  BP: 128/70 132/70 132/60 (!) 143/73  Pulse: (!) 108 (!) 106 98 93  Resp: 20 20 19 16   Temp: 99 F (37.2 C) 99.5 F (37.5 C)  98.9 F (37.2 C) 99.1 F (37.3 C)  TempSrc:    Oral  SpO2: 94% 94% 94% 95%  Weight:      Height:        Intake/Output Summary (Last 24 hours) at 09/08/2019 1029 Last data filed at 09/08/2019 1000 Gross per 24 hour  Intake 3192.77 ml  Output 1500 ml  Net 1692.77 ml   Filed Weights   09/07/19 0704  Weight: 111.1 kg    Examination:  General exam: Awake alert coherent no distress EOMI NCAT no focal deficit thick neck Respiratory system: Clinically clear no rales Cardiovascular system: S1-S2 tachycardic Gastrointestinal system: Soft slightly distended bladder. Central nervous system: Neurologically  intact Extremities: None Skin: No swelling Psychiatry: Euthymic  Data Reviewed: I have personally reviewed following labs and imaging studies Over read of x-rays on my over read shows confluences in both lung fields bilaterally and costophrenic angles BUN/creatinine 30/1.6-->23/1.4 White count 20.6-->14   Radiology Studies: CT Chest W Contrast  Result Date: 09/07/2019 CLINICAL DATA:  Abdominal abscess or infection suspected. Fever and weakness EXAM: CT CHEST, ABDOMEN, AND PELVIS WITH CONTRAST TECHNIQUE: Multidetector CT imaging of the chest, abdomen and pelvis was performed following the standard protocol during bolus administration of intravenous contrast. CONTRAST:  72mL OMNIPAQUE IOHEXOL 300 MG/ML  SOLN COMPARISON:  None. FINDINGS: CT CHEST FINDINGS Cardiovascular: Normal heart size. No pericardial effusion. Aortic and coronary atherosclerosis. Aortic valvular calcification. No acute vascular finding. Mediastinum/Nodes: Negative for adenopathy. Lungs/Pleura: There is no edema, consolidation, effusion, or pneumothorax. Musculoskeletal: Extensive disc degeneration. There are levels of endplate irregularity that is multilevel and degenerative appearing. No paravertebral inflammatory soft tissue findings. Postoperative right shoulder with rotator cuff atrophy. CT ABDOMEN PELVIS FINDINGS Hepatobiliary: No focal liver abnormality.No evidence of biliary obstruction or stone. Pancreas: Unremarkable. Spleen: Unremarkable. Adrenals/Urinary Tract: Negative adrenals. No hydronephrosis or stone. Unremarkable bladder. Stomach/Bowel:  No obstruction. No appendicitis. Vascular/Lymphatic: No acute vascular abnormality. Extensive atherosclerotic calcification. No mass or adenopathy. Reproductive:No pathologic findings. Other: No ascites or pneumoperitoneum. Musculoskeletal: No acute abnormalities. Advanced lumbar spine degeneration with L4-5 anterolisthesis and high-grade canal/right foraminal stenosis. No evidence  of bony infection. IMPRESSION: 1. No explanation for fever. No acute finding in the chest or abdomen. 2.  Aortic Atherosclerosis (ICD10-I70.0). Electronically Signed   By: Marnee Spring M.D.   On: 09/07/2019 09:25   CT Abdomen Pelvis W Contrast  Result Date: 09/07/2019 CLINICAL DATA:  Abdominal abscess or infection suspected. Fever and weakness EXAM: CT CHEST, ABDOMEN, AND PELVIS WITH CONTRAST TECHNIQUE: Multidetector CT imaging of the chest, abdomen and pelvis was performed following the standard protocol during bolus administration of intravenous contrast. CONTRAST:  38mL OMNIPAQUE IOHEXOL 300 MG/ML  SOLN COMPARISON:  None. FINDINGS: CT CHEST FINDINGS Cardiovascular: Normal heart size. No pericardial effusion. Aortic and coronary atherosclerosis. Aortic valvular calcification. No acute vascular finding. Mediastinum/Nodes: Negative for adenopathy. Lungs/Pleura: There is no edema, consolidation, effusion, or pneumothorax. Musculoskeletal: Extensive disc degeneration. There are levels of endplate irregularity that is multilevel and degenerative appearing. No paravertebral inflammatory soft tissue findings. Postoperative right shoulder with rotator cuff atrophy. CT ABDOMEN PELVIS FINDINGS Hepatobiliary: No focal liver abnormality.No evidence of biliary obstruction or stone. Pancreas: Unremarkable. Spleen: Unremarkable. Adrenals/Urinary Tract: Negative adrenals. No hydronephrosis or stone. Unremarkable bladder. Stomach/Bowel:  No obstruction. No appendicitis. Vascular/Lymphatic: No acute vascular abnormality. Extensive atherosclerotic calcification. No mass or adenopathy. Reproductive:No pathologic findings. Other: No ascites or pneumoperitoneum. Musculoskeletal: No acute abnormalities. Advanced lumbar spine degeneration with L4-5 anterolisthesis and high-grade canal/right foraminal stenosis. No evidence  of bony infection. IMPRESSION: 1. No explanation for fever. No acute finding in the chest or abdomen. 2.   Aortic Atherosclerosis (ICD10-I70.0). Electronically Signed   By: Marnee Spring M.D.   On: 09/07/2019 09:25   DG Chest Port 1 View  Result Date: 09/07/2019 CLINICAL DATA:  Fever and rigors EXAM: PORTABLE CHEST 1 VIEW COMPARISON:  None. FINDINGS: Normal heart size and mediastinal contours. Low volume chest without acute infiltrate or edema. No effusion or pneumothorax. No acute osseous findings. IMPRESSION: No visible pneumonia. Electronically Signed   By: Marnee Spring M.D.   On: 09/07/2019 07:48     Scheduled Meds: . Chlorhexidine Gluconate Cloth  6 each Topical Daily  . enoxaparin (LOVENOX) injection  40 mg Subcutaneous Q24H  . insulin aspart protamine- aspart  35 Units Subcutaneous BID WC  . rOPINIRole  6 mg Oral QHS  . tamsulosin  0.4 mg Oral Daily   Continuous Infusions: . sodium chloride 125 mL/hr at 09/08/19 0504  . ceFEPime (MAXIPIME) IV 2 g (09/08/19 0809)  . metronidazole 500 mg (09/08/19 0504)  . vancomycin 1,250 mg (09/08/19 1213)     LOS: 1 day    Time spent: 40 minutes  Rhetta Mura, MD [facility] To contact the attending provider between 7A-7P or the covering provider during after hours 7P-7A, please log into the web site [email or web address] and access using universal [place] password for that web site. If you do not have the password, please call the hospital operator.  09/08/2019, 10:29 AM

## 2019-09-08 NOTE — Progress Notes (Signed)
   09/08/19 1108  Assess: MEWS Score  Temp 100.2 F (37.9 C)  BP 132/68  Pulse Rate (!) 123  Resp 20  SpO2 93 %  O2 Device Room Air  Assess: MEWS Score  MEWS Temp 0  MEWS Systolic 0  MEWS Pulse 2  MEWS RR 0  MEWS LOC 0  MEWS Score 2  MEWS Score Color Yellow  Assess: if the MEWS score is Yellow or Red  Were vital signs taken at a resting state? Yes  Focused Assessment Documented focused assessment  Early Detection of Sepsis Score *See Row Information* High  MEWS guidelines implemented *See Row Information* Yes  Treat  MEWS Interventions Escalated (See documentation below)  Take Vital Signs  Increase Vital Sign Frequency  Yellow: Q 2hr X 2 then Q 4hr X 2, if remains yellow, continue Q 4hrs  Escalate  MEWS: Escalate Yellow: discuss with charge nurse/RN and consider discussing with provider and RRT  Notify: Provider  Provider Name/Title samtani  Date Provider Notified 09/08/19  Time Provider Notified 1108  Notification Type Page  Notification Reason Change in status  Response See new orders  Date of Provider Response 09/08/19  Time of Provider Response 1108  Notify: Rapid Response  Name of Rapid Response RN Notified Zoe RN  Date Rapid Response Notified 09/08/19  Time Rapid Response Notified 1108  Document  Patient Outcome Stabilized after interventions  Progress note created (see row info) Yes

## 2019-09-09 LAB — CBC WITH DIFFERENTIAL/PLATELET
Abs Immature Granulocytes: 0.04 10*3/uL (ref 0.00–0.07)
Basophils Absolute: 0 10*3/uL (ref 0.0–0.1)
Basophils Relative: 1 %
Eosinophils Absolute: 0.3 10*3/uL (ref 0.0–0.5)
Eosinophils Relative: 4 %
HCT: 35.6 % — ABNORMAL LOW (ref 39.0–52.0)
Hemoglobin: 12.1 g/dL — ABNORMAL LOW (ref 13.0–17.0)
Immature Granulocytes: 1 %
Lymphocytes Relative: 18 %
Lymphs Abs: 1.3 10*3/uL (ref 0.7–4.0)
MCH: 31 pg (ref 26.0–34.0)
MCHC: 34 g/dL (ref 30.0–36.0)
MCV: 91.3 fL (ref 80.0–100.0)
Monocytes Absolute: 0.4 10*3/uL (ref 0.1–1.0)
Monocytes Relative: 6 %
Neutro Abs: 5 10*3/uL (ref 1.7–7.7)
Neutrophils Relative %: 70 %
Platelets: 157 10*3/uL (ref 150–400)
RBC: 3.9 MIL/uL — ABNORMAL LOW (ref 4.22–5.81)
RDW: 13.2 % (ref 11.5–15.5)
WBC: 7 10*3/uL (ref 4.0–10.5)
nRBC: 0 % (ref 0.0–0.2)

## 2019-09-09 LAB — COMPREHENSIVE METABOLIC PANEL
ALT: 149 U/L — ABNORMAL HIGH (ref 0–44)
AST: 147 U/L — ABNORMAL HIGH (ref 15–41)
Albumin: 3.2 g/dL — ABNORMAL LOW (ref 3.5–5.0)
Alkaline Phosphatase: 40 U/L (ref 38–126)
Anion gap: 7 (ref 5–15)
BUN: 22 mg/dL (ref 8–23)
CO2: 24 mmol/L (ref 22–32)
Calcium: 8.6 mg/dL — ABNORMAL LOW (ref 8.9–10.3)
Chloride: 103 mmol/L (ref 98–111)
Creatinine, Ser: 1.43 mg/dL — ABNORMAL HIGH (ref 0.61–1.24)
GFR calc Af Amer: 57 mL/min — ABNORMAL LOW (ref >60)
GFR calc non Af Amer: 49 mL/min — ABNORMAL LOW (ref >60)
Glucose, Bld: 176 mg/dL — ABNORMAL HIGH (ref 70–99)
Potassium: 4 mmol/L (ref 3.5–5.1)
Sodium: 134 mmol/L — ABNORMAL LOW (ref 135–145)
Total Bilirubin: 0.8 mg/dL (ref 0.3–1.2)
Total Protein: 6.6 g/dL (ref 6.5–8.1)

## 2019-09-09 LAB — GLUCOSE, CAPILLARY
Glucose-Capillary: 172 mg/dL — ABNORMAL HIGH (ref 70–99)
Glucose-Capillary: 204 mg/dL — ABNORMAL HIGH (ref 70–99)
Glucose-Capillary: 187 mg/dL — ABNORMAL HIGH (ref 70–99)
Glucose-Capillary: 228 mg/dL — ABNORMAL HIGH (ref 70–99)

## 2019-09-09 MED ORDER — GABAPENTIN 300 MG PO CAPS
600.0000 mg | ORAL_CAPSULE | Freq: Every day | ORAL | Status: DC
  Administered 2019-09-09 – 2019-09-10 (×2): 600 mg via ORAL
  Filled 2019-09-09 (×2): qty 2

## 2019-09-09 MED ORDER — AMLODIPINE BESYLATE 5 MG PO TABS
5.0000 mg | ORAL_TABLET | Freq: Every day | ORAL | Status: DC
  Administered 2019-09-09 – 2019-09-11 (×3): 5 mg via ORAL
  Filled 2019-09-09 (×3): qty 1

## 2019-09-09 MED ORDER — GABAPENTIN 300 MG PO CAPS
300.0000 mg | ORAL_CAPSULE | Freq: Every day | ORAL | Status: DC
  Administered 2019-09-09 – 2019-09-11 (×3): 300 mg via ORAL
  Filled 2019-09-09 (×3): qty 1

## 2019-09-09 MED ORDER — LISINOPRIL 5 MG PO TABS
5.0000 mg | ORAL_TABLET | Freq: Every day | ORAL | Status: DC
  Administered 2019-09-09 – 2019-09-11 (×3): 5 mg via ORAL
  Filled 2019-09-09 (×3): qty 1

## 2019-09-09 NOTE — Care Management Important Message (Signed)
Important Message  Patient Details IM Letter presented to the Patient Name: Ronald Cooley MRN: [id number] Date of Birth: [birth year]   Medicare Important Message Given:  Yes     Caren Macadam 09/09/2019, 9:56 AM

## 2019-09-09 NOTE — Progress Notes (Signed)
NS 167ml/hr stopped at this time per order.

## 2019-09-09 NOTE — Progress Notes (Signed)
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 Tachycardia hypoxic respiratory failure a. 2/2 volume overload, 4.5 L positive b. Lasix given and flomax as well c. much improved 2. Sepsis 3. Transaminitis query cause i. I think sepsis is causing the mild transaminitis that this patient has he also is quite rotund ii. If it resolves I would not do any thing differently b. NGTD urine/blood c. Rpt BC NGTD  09/08/19 today as Tmax >100.5 d. Narrow abx to cefepime and Flagyll--stop Vanc e. recheck tmax curve 4. AKI on admission a. At home lisinopril 40, HCTZ 25 both of which have been held since admission which may account for the AKI he had in addition to obstructive uropathy as below b. Resume lisinopril at 2.5 dose today c. Saline locked on 7/1 d. Recheck labs a.m. 5. 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 Foley catheter 7/1--Clamp today--continue Flomax this a.m. b. Might need OP urology follow up as prn 6. Volume overload 7. Mild aortic stenosis a. Stable  currently b. Resuming lisniopril lower dose 10 c. amlodipine 8. DM TY 2 with nephropathy a. 70/30 insulin 35 units down from home dose of 50s b. Good control 172-204   c. resume gabapentin,  glipizide today 9. Morbid obesity with OHSS a. Tolerating CPAP at night-monitor trends   DVT prophylaxis:  Code Status: full Family Communication: called daughter on telephone and updated fully 7/2 Larrie Kass Disposition:   Status is: Inpatient  Remains inpatient appropriate because:Persistent severe electrolyte disturbances, IV treatments appropriate due to intensity of illness or inability to take PO and Inpatient level of care appropriate due to severity of illness   Dispo: The patient is from: Home              Anticipated d/c is to: Home              Anticipated d/c date is: 3 days              Patient currently is not medically stable to d/c.   Consultants:   n  Procedures: n  Antimicrobials: vanc, cefepime flagyll    Subjective: Overall doing much better Eating drinking Comfortable Foley in place Has no abdominal pain Has never been told he has liver issues Objective: Vitals:   09/08/19 2302 09/09/19 0200 09/09/19 0601 09/09/19 1006  BP:  (!) 147/75 126/78 137/73  Pulse:  96 86 80  Resp: (!) 21 16 18 20   Temp:  99 F (37.2 C) 97.7 F (36.5 C) 97.7 F (36.5 C)  TempSrc:  Oral  SpO2:  94% 94% 96%  Weight:      Height:        Intake/Output Summary (Last 24 hours) at 09/09/2019 1243 Last data filed at 09/09/2019 1051 Gross per 24 hour  Intake 236 ml  Output 1650 ml  Net -1414 ml   Filed Weights   09/07/19 0704  Weight: 111.1 kg    Examination:  General exam: Awake alert coherent no distress EOMI NCAT no focal deficit thick neck Respiratory system: Clinically clear no rales Cardiovascular system: S1-S2 tachycardic Gastrointestinal system: Soft obese no Murphy sign no right upper quadrant pain Central nervous system: Neurologically  intact Extremities: None Skin: No swelling Psychiatry: Euthymic  Data Reviewed: I have personally reviewed following labs and imaging studies Sodium 134 BUN/creatinine down to 22/1.4 LFTs are up to 147 and 149 respectively White count down to seven hemoglobin 12 platelet 157   Radiology Studies: DG Chest 1 View  Result Date: 09/08/2019 CLINICAL DATA:  74 year old male with history of acute respiratory distress. Shortness of breath. EXAM: CHEST  1 VIEW COMPARISON:  Chest x-ray 09/07/2019. FINDINGS: Lung volumes are normal. No consolidative airspace disease. No pleural effusions. No pneumothorax. No pulmonary nodule or mass noted. Pulmonary vasculature and the cardiomediastinal silhouette are within normal limits. Atherosclerosis in the thoracic aorta. IMPRESSION: 1.  No radiographic evidence of acute cardiopulmonary disease. 2. Aortic atherosclerosis. Electronically Signed   By: Trudie Reed M.D.   On: 09/08/2019 10:57     Scheduled Meds: . amLODipine  5 mg Oral Daily  . Chlorhexidine Gluconate Cloth  6 each Topical Daily  . enoxaparin (LOVENOX) injection  40 mg Subcutaneous Q24H  . gabapentin  300 mg Oral Daily  . gabapentin  600 mg Oral QHS  . insulin aspart protamine- aspart  35 Units Subcutaneous BID WC  . rOPINIRole  6 mg Oral QHS  . tamsulosin  0.4 mg Oral Daily   Continuous Infusions: . ceFEPime (MAXIPIME) IV 2 g (09/09/19 0900)  . metronidazole 500 mg (09/09/19 0554)     LOS: 2 days    Time spent: 40 minutes  Rhetta Mura, MD [facility] To contact the attending provider between 7A-7P or the covering provider during after hours 7P-7A, please log into the web site [email or web address] and access using universal [place] password for that web site. If you do not have the password, please call the hospital operator.  09/09/2019, 12:43 PM

## 2019-09-10 ENCOUNTER — Inpatient Hospital Stay (HOSPITAL_COMMUNITY): Payer: Medicare Other

## 2019-09-10 LAB — COMPREHENSIVE METABOLIC PANEL
ALT: 250 U/L — ABNORMAL HIGH (ref 0–44)
AST: 169 U/L — ABNORMAL HIGH (ref 15–41)
Albumin: 3.3 g/dL — ABNORMAL LOW (ref 3.5–5.0)
Alkaline Phosphatase: 46 U/L (ref 38–126)
Anion gap: 9 (ref 5–15)
BUN: 17 mg/dL (ref 8–23)
CO2: 24 mmol/L (ref 22–32)
Calcium: 9 mg/dL (ref 8.9–10.3)
Chloride: 102 mmol/L (ref 98–111)
Creatinine, Ser: 1.26 mg/dL — ABNORMAL HIGH (ref 0.61–1.24)
GFR calc Af Amer: >60 mL/min (ref >60)
GFR calc non Af Amer: 57 mL/min — ABNORMAL LOW (ref >60)
Glucose, Bld: 213 mg/dL — ABNORMAL HIGH (ref 70–99)
Potassium: 4.4 mmol/L (ref 3.5–5.1)
Sodium: 135 mmol/L (ref 135–145)
Total Bilirubin: 0.8 mg/dL (ref 0.3–1.2)
Total Protein: 7.3 g/dL (ref 6.5–8.1)

## 2019-09-10 LAB — CBC WITH DIFFERENTIAL/PLATELET
Abs Immature Granulocytes: 0.04 10*3/uL (ref 0.00–0.07)
Basophils Absolute: 0.1 10*3/uL (ref 0.0–0.1)
Basophils Relative: 1 %
Eosinophils Absolute: 0.5 10*3/uL (ref 0.0–0.5)
Eosinophils Relative: 8 %
HCT: 42.1 % (ref 39.0–52.0)
Hemoglobin: 14.2 g/dL (ref 13.0–17.0)
Immature Granulocytes: 1 %
Lymphocytes Relative: 29 %
Lymphs Abs: 1.6 10*3/uL (ref 0.7–4.0)
MCH: 31.1 pg (ref 26.0–34.0)
MCHC: 33.7 g/dL (ref 30.0–36.0)
MCV: 92.3 fL (ref 80.0–100.0)
Monocytes Absolute: 0.5 10*3/uL (ref 0.1–1.0)
Monocytes Relative: 9 %
Neutro Abs: 2.9 10*3/uL (ref 1.7–7.7)
Neutrophils Relative %: 52 %
Platelets: 197 10*3/uL (ref 150–400)
RBC: 4.56 MIL/uL (ref 4.22–5.81)
RDW: 13.1 % (ref 11.5–15.5)
WBC: 5.6 10*3/uL (ref 4.0–10.5)
nRBC: 0 % (ref 0.0–0.2)

## 2019-09-10 LAB — GLUCOSE, CAPILLARY
Glucose-Capillary: 206 mg/dL — ABNORMAL HIGH (ref 70–99)
Glucose-Capillary: 186 mg/dL — ABNORMAL HIGH (ref 70–99)
Glucose-Capillary: 174 mg/dL — ABNORMAL HIGH (ref 70–99)
Glucose-Capillary: 137 mg/dL — ABNORMAL HIGH (ref 70–99)

## 2019-09-10 MED ORDER — SODIUM CHLORIDE 0.9 % IV SOLN
2.0000 g | Freq: Three times a day (TID) | INTRAVENOUS | Status: DC
  Filled 2019-09-10: qty 2

## 2019-09-10 MED ORDER — GLIPIZIDE 10 MG PO TABS
10.0000 mg | ORAL_TABLET | Freq: Two times a day (BID) | ORAL | Status: DC
  Administered 2019-09-11: 10 mg via ORAL
  Filled 2019-09-10: qty 1

## 2019-09-10 MED ORDER — CEPHALEXIN 500 MG PO CAPS
500.0000 mg | ORAL_CAPSULE | Freq: Two times a day (BID) | ORAL | Status: DC
  Administered 2019-09-10 – 2019-09-11 (×2): 500 mg via ORAL
  Filled 2019-09-10 (×2): qty 1

## 2019-09-10 MED ORDER — INSULIN ASPART PROT & ASPART (70-30 MIX) 100 UNIT/ML ~~LOC~~ SUSP
56.0000 [IU] | Freq: Two times a day (BID) | SUBCUTANEOUS | Status: DC
  Administered 2019-09-11: 56 [IU] via SUBCUTANEOUS

## 2019-09-10 NOTE — Progress Notes (Signed)
Patient is NPO due to a scheduled abdominal ultrasound for 2030. Insulin and glipizide not given. MD notified.

## 2019-09-10 NOTE — Progress Notes (Signed)
Pharmacy Antibiotic Note  Ronald Cooley is a 74 y.o. male admitted on 09/07/2019 with fever of unknown origin.  Pharmacy has been consulted for vancomycin/cefepime dosing. Also with Flagyl ordered x 1 dose per EDP. Tmax/24h 102.6, WBC 11.2, LA 1.2. SCr 1.67 on admit.  09/10/19 11:35 AM  - SCr improved to 1.26, WBC wnl, afebrile  Plan: Increase cefepime to 2 g iv q 8h Continue Flagyl 500 mg iv q 8h Monitor clinical progress, c/s, renal function F/u de-escalation plan/LOT   Height: 5\' 8"  (172.7 cm) Weight: 111.1 kg (245 lb) IBW/kg (Calculated) : 68.4  Temp (24hrs), Avg:98.2 F (36.8 C), Min:97.9 F (36.6 C), Max:98.7 F (37.1 C)  Recent Labs  Lab 09/07/19 0728 09/07/19 1726 09/08/19 0323 09/09/19 0847 09/10/19 0911  WBC 11.2* 20.6* 14.1* 7.0 5.6  CREATININE 1.67* 1.53* 1.49* 1.43* 1.26*  LATICACIDVEN 1.2  --   --   --   --     Estimated Creatinine Clearance: 65 mL/min (A) (by C-G formula based on SCr of 1.26 mg/dL (H)).    No Known Allergies  Antimicrobials this admission:  6/30 metronidazole  >>  6/30 vancomycin >> 7/2 6/30 cefepime >>   Dose adjustments this admission:  7/3 cefepime 2 g iv q 12h to q8h  Microbiology results:  6/30 BCx: NGTD 7/1 BCx: NGTD 6/30 UCx:  multiple species, suggest recollection  7/30, PharmD, BCPS    09/10/2019 11:34 AM

## 2019-09-10 NOTE — Progress Notes (Signed)
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 No ill contacts but is not sure if the place he was at had any contamination No exposures to [employer] me that he has trouble with his urinary stream since today but has always had some issues also tells me that he has occasional sciatic pain but no other swollen joints or swellings [facility] [place]-found to have high fever 103-started on empiric antibiotic coverage given a bolus of fluids and discussed admission He has improved but he has developed a transaminitis He currently has a Foley catheter We are getting an ultrasound of his abdomen pelvis today to determine further issues  Assessment & Plan:   Active Problems:   Sepsis (HCC)   1. Tachycardia on 7/1 secondary to urinary retention-acute a. 2/2 volume overload, 4.5 L positive b. Has resolved with placement of Foley 2. Sepsis 3. Transaminitis query cause i. ALT has risen abruptly-not sure etiology-"sepsis versus other ii. Get ultrasound abdomen pelvis 7/3 b. NGTD urine/blood c. Blood cultures to date are negative including from 7/1 d. Narrow abx to Keflex twice daily e. recheck tmax curve 4. AKI on admission a. At home lisinopril 40, HCTZ 25 both of which have been held since admission which may account for the AKI +Obstruction b. Resume lisinopril at 5 dose today c. Saline locked on 7/1 d. Recheck labs a.m. 5. 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 Foley catheter 7/1--Clamp today--continue Flomax this a.m. b. Might need OP  urology follow up as prn 6. Volume overload-resolved as above 7. Mild aortic stenosis 8. Blood pressure uncontrolled a. Stable currently b. Resuming lisniopril lower dose 5 Amlodipine 5--May need to add another agent in the next several days 9. DM TY 2 with nephropathy a. 70/30 insulin 56 units -180 6206 b. resume gabapentin,  glipizide today 10. Morbid obesity with OHSS a. Tolerating CPAP at night-monitor trends   DVT prophylaxis:  Code Status: full Family Communication: updated daughter at bedside 7/3 Disposition:   Status is: Inpatient  Remains inpatient appropriate because:Persistent severe electrolyte disturbances, IV treatments appropriate due to intensity of illness or inability to take PO and Inpatient level of care appropriate due to severity of illness   Dispo: The patient is from: Home              Anticipated d/c is to: Home              Anticipated d/c date is: 1 day              Patient currently is not medically stable to d/c.   Consultants:   n  Procedures: n  Antimicrobials:  vanc, cefepime flagyll --> narrowed to p.o. Keflex on 7/3   Subjective: Feels fair Ambulating No chest pain No nausea no vomiting eating drinking Questions about his meds  Objective: Vitals:   09/09/19 1006 09/09/19 1524 09/09/19 2013 09/10/19 0225  BP: 137/73 133/71 (!) 145/78 (!) 149/84  Pulse: 80 88 78 87  Resp: 20 20 16 18   Temp: 97.7 F (36.5 C) 97.9 F (36.6 C) 98.7  F (37.1 C) 98 F (36.7 C)  TempSrc: Oral Oral    SpO2: 96% 94% 97% 95%  Weight:      Height:        Intake/Output Summary (Last 24 hours) at 09/10/2019 1000 Last data filed at 09/10/2019 0500 Gross per 24 hour  Intake 1180 ml  Output 2885 ml  Net -1705 ml   Filed Weights   09/07/19 0704  Weight: 111.1 kg    Examination:  General exam: Awake alert coherent no distress  Respiratory system: Clinically clear no rales Cardiovascular system: S1-S2 tachycardic Gastrointestinal system: Soft  obese nontender Central nervous system: Neurologically intact Extremities: None Skin: No swelling Psychiatry: Euthymic  Data Reviewed: I have personally reviewed following labs and imaging studies AST/ALT are elevated to 169 and 250 respectively BUN/creatinine however is down to 17/1.2 Albumin is 3.3 White count is 5.6 hemoglobin 14 platelet 197  Radiology Studies: No results found.   Scheduled Meds:  amLODipine  5 mg Oral Daily   cephALEXin  500 mg Oral Q12H   Chlorhexidine Gluconate Cloth  6 each Topical Daily   enoxaparin (LOVENOX) injection  40 mg Subcutaneous Q24H   gabapentin  300 mg Oral Daily   gabapentin  600 mg Oral QHS   glipiZIDE  10 mg Oral BID AC   insulin aspart protamine- aspart  56 Units Subcutaneous BID WC   lisinopril  5 mg Oral Daily   rOPINIRole  6 mg Oral QHS   tamsulosin  0.4 mg Oral Daily   Continuous Infusions:    LOS: 3 days    Time spent: 30 minutes  Rhetta Mura, MD [facility] To contact the attending provider between 7A-7P or the covering provider during after hours 7P-7A, please log into the web site [email or web address] and access using universal [place] password for that web site. If you do not have the password, please call the hospital operator.  09/10/2019, 10:00 AM

## 2019-09-11 LAB — CBC WITH DIFFERENTIAL/PLATELET
Abs Immature Granulocytes: 0.07 10*3/uL (ref 0.00–0.07)
Basophils Absolute: 0.1 10*3/uL (ref 0.0–0.1)
Basophils Relative: 1 %
Eosinophils Absolute: 0.4 10*3/uL (ref 0.0–0.5)
Eosinophils Relative: 7 %
HCT: 37.5 % — ABNORMAL LOW (ref 39.0–52.0)
Hemoglobin: 12.7 g/dL — ABNORMAL LOW (ref 13.0–17.0)
Immature Granulocytes: 1 %
Lymphocytes Relative: 34 %
Lymphs Abs: 2.1 10*3/uL (ref 0.7–4.0)
MCH: 31.1 pg (ref 26.0–34.0)
MCHC: 33.9 g/dL (ref 30.0–36.0)
MCV: 91.9 fL (ref 80.0–100.0)
Monocytes Absolute: 0.7 10*3/uL (ref 0.1–1.0)
Monocytes Relative: 12 %
Neutro Abs: 2.9 10*3/uL (ref 1.7–7.7)
Neutrophils Relative %: 45 %
Platelets: 209 10*3/uL (ref 150–400)
RBC: 4.08 MIL/uL — ABNORMAL LOW (ref 4.22–5.81)
RDW: 12.9 % (ref 11.5–15.5)
WBC: 6.3 10*3/uL (ref 4.0–10.5)
nRBC: 0 % (ref 0.0–0.2)

## 2019-09-11 LAB — COMPREHENSIVE METABOLIC PANEL
ALT: 180 U/L — ABNORMAL HIGH (ref 0–44)
AST: 82 U/L — ABNORMAL HIGH (ref 15–41)
Albumin: 3 g/dL — ABNORMAL LOW (ref 3.5–5.0)
Alkaline Phosphatase: 42 U/L (ref 38–126)
Anion gap: 8 (ref 5–15)
BUN: 20 mg/dL (ref 8–23)
CO2: 24 mmol/L (ref 22–32)
Calcium: 9 mg/dL (ref 8.9–10.3)
Chloride: 103 mmol/L (ref 98–111)
Creatinine, Ser: 1.35 mg/dL — ABNORMAL HIGH (ref 0.61–1.24)
GFR calc Af Amer: >60 mL/min (ref >60)
GFR calc non Af Amer: 52 mL/min — ABNORMAL LOW (ref >60)
Glucose, Bld: 199 mg/dL — ABNORMAL HIGH (ref 70–99)
Potassium: 4.3 mmol/L (ref 3.5–5.1)
Sodium: 135 mmol/L (ref 135–145)
Total Bilirubin: 0.5 mg/dL (ref 0.3–1.2)
Total Protein: 6.5 g/dL (ref 6.5–8.1)

## 2019-09-11 LAB — PROTIME-INR
INR: 1 (ref 0.8–1.2)
Prothrombin Time: 13 seconds (ref 11.4–15.2)

## 2019-09-11 LAB — GLUCOSE, CAPILLARY: Glucose-Capillary: 185 mg/dL — ABNORMAL HIGH (ref 70–99)

## 2019-09-11 MED ORDER — LISINOPRIL 5 MG PO TABS: 5.0000 mg | ORAL_TABLET | Freq: Every day | ORAL | 0 refills | Status: AC

## 2019-09-11 MED ORDER — TAMSULOSIN HCL 0.4 MG PO CAPS: 0.4000 mg | ORAL_CAPSULE | Freq: Every day | ORAL | 11 refills | Status: AC

## 2019-09-11 MED ORDER — CEPHALEXIN 500 MG PO CAPS: 500.0000 mg | ORAL_CAPSULE | Freq: Two times a day (BID) | ORAL | 0 refills | Status: AC

## 2019-09-11 NOTE — Progress Notes (Signed)
Patient is being discharged home. Discharge instructions including medications and follow up appointments given. Pt had no further questions at this time. 

## 2019-09-11 NOTE — Discharge Summary (Signed)
Physician Discharge Summary  Ronald Cooley ZOX:[id number] DOB: 1945/04/04 DOA: 09/07/2019  PCP: Martyn Malay, MD  Admit date: 09/07/2019 Discharge date: 09/11/2019  Time spent: 37 minutes  Recommendations for Outpatient Follow-up:  1. Repeat Chem-12 and CBC in 2 to 3days PCP office-may need referral to gastroenterology if LFTs are not resolving 2. Does require weight loss counseling in the outpatient setting and should also work on mobility as well as low impact exercise 3. Complete Keflex for presumed urinary infection on admission-all cultures were negative 4. Notice changes to antihypertensive regimen for the time being which can be reimplemented once labs are done  Discharge Diagnoses:  Active Problems:   Sepsis 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 No ill contacts but is not sure if the place he was at had any contamination No exposures to [employer] me that he has trouble with his urinary stream since today but has always had some issues also tells me that he has occasional sciatic pain but no other swollen joints or swellings [facility] [place]-found to have high fever 103-started on empiric antibiotic coverage given a bolus of fluids and discussed admission He has improved but he has developed a transaminitis He currently has a Foley catheter We are getting an ultrasound of his abdomen pelvis today to determine further issues  Hospital Course:  1. Tachycardia on 7/1 secondary to  urinary retention-acute a. 2/2 volume overload, 4.5 L positive b. Has resolved with placement of Foley c. Foley was removed on 7/3 and he was urinating fairly well 2. Sepsis 3. Transaminitis query cause i. ALT has risen abruptly-etiology likely secondary to sepsis and LFTs are resolving ii. Ultrasound pelvis 7/3  showed fatty liver without source iii. He will need outpatient LFTs however reasonably stable and no pain and so can get this done not emergently does not need to stay in the hospital from my perspective a. NGTD urine/blood b. Blood cultures to date are negative including from 7/1 c. Narrow abx to Keflex twice daily to complete course as prescribed 4. AKI on admission a. At home lisinopril 40, HCTZ 25 both of which have been held since admission which may account for the AKI +Obstruction b. Resume lisinopril at 5 dose today c. Saline locked on 7/1 and labs have been stable 5. 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 Foley catheter 7/1--removed as above and urinating fair 6. Volume overload-resolved as above 7. Mild aortic stenosis 8. Blood pressure uncontrolled a. Stable currently b. Resuming lisniopril lower dose 5 Amlodipine 5 outpatient reeval 9. DM TY 2 with nephropathy a. 70/30 insulin 56 units        [phone] b. resume gabapentin,  glipizide today 10. Morbid obesity with OHSS a. Tolerating CPAP at night-monitor trends  Procedures:  Ultrasound  Consultations:  None  Discharge Exam: Vitals:   09/10/19 2042 09/11/19 0535  BP: (!) 154/86 129/81  Pulse: 94 89  Resp: 20 16  Temp: 98.8 F (37.1 C) 97.8 F (36.6 C)  SpO2: 94% 93%  General: Awake alert coherent no distress thick neck Mallampati 2 Cardiovascular: S1-S2 no murmur Respiratory: Clear no added sound No abdominal pain or swelling No lower extremity  Discharge Instructions   Discharge Instructions    Diet - low sodium heart healthy   Complete by: As directed    Discharge instructions    Complete by: As directed    Please look at your medications carefully of certain ones such as your supplements should be stopped We have discontinued some of your blood pressure meds and/or changed the doses because of severe dehydration on discharge and I would not resume them until you get labs done As we discussed your liver enzymes became deranged when you were admitted likely because you had a severe infection and this could be in your body's way of acclimatizing to that-I suspect that they will come down but until that time use Tylenol sparingly Your ultrasound did not really show any specific signs or symptoms and I feel comfortable sending you on your way home as long as you can guarantee that you get lab work in about 2 to 3 days at your primary care physician's office and ensure that you follow their advice when you are seen by them My recommendation would be to finish also the antibiotics that we have prescribed for you for what I feel was more likely than not a urinary infection and you should complete this as well as start the Flomax that we have prescribed for you this admission TakeCare have a happy fourth and good luck to you   Increase activity slowly   Complete by: As directed      Allergies as of 09/11/2019   No Known Allergies     Medication List    STOP taking these medications   acetaminophen 650 MG CR tablet Commonly known as: TYLENOL   aspirin 81 MG chewable tablet   atorvastatin 20 MG tablet Commonly known as: LIPITOR   b complex vitamins capsule   clotrimazole-betamethasone cream Commonly known as: LOTRISONE   Garlic 1000 MG Caps   Ginkgo Biloba 40 MG Tabs   hydrochlorothiazide 25 MG tablet Commonly known as: HYDRODIURIL   Omega-3 500 MG Caps   Oncovite Tabs   sildenafil 100 MG tablet Commonly known as: VIAGRA     TAKE these medications   amLODipine 5 MG tablet Commonly known as: NORVASC Take 5 mg by mouth daily.   cephALEXin 500 MG  capsule Commonly known as: KEFLEX Take 1 capsule (500 mg total) by mouth every 12 (twelve) hours.   diclofenac Sodium 1 % Gel Commonly known as: VOLTAREN Place onto the skin.   dorzolamide-timolol 22.3-6.8 MG/ML ophthalmic solution Commonly known as: COSOPT Place 1 drop into both eyes 2 (two) times daily.   fluticasone 50 MCG/ACT nasal spray Commonly known as: FLONASE Place 1 spray into both nostrils daily as needed for allergies or rhinitis.   gabapentin 300 MG capsule Commonly known as: NEURONTIN Take 300 mg by mouth See admin instructions. Takes 1 tablet in the morning and 2 tablets at night   glipiZIDE 10 MG tablet Commonly known as: GLUCOTROL Take 10 mg by mouth 2 (two) times daily.   latanoprost 0.005 % ophthalmic solution Commonly known as: XALATAN Place 1 drop into both eyes at bedtime.   lisinopril 5 MG tablet Commonly known as: ZESTRIL Take 1 tablet (5 mg total) by mouth daily. What changed:   medication strength  how much to take   NovoLIN 70/30 (70-30) 100 UNIT/ML  injection Generic drug: insulin NPH-regular Human Inject 56 Units into the skin 2 (two) times daily with a meal.   omeprazole 20 MG capsule Commonly known as: PRILOSEC Take 20 mg by mouth daily as needed (for heart burn).   rOPINIRole 3 MG tablet Commonly known as: REQUIP Take 6 mg by mouth at bedtime.   tamsulosin 0.4 MG Caps capsule Commonly known as: FLOMAX Take 1 capsule (0.4 mg total) by mouth daily. Start taking on: September 12, 2019   tiZANidine 4 MG tablet Commonly known as: ZANAFLEX Take 4 mg by mouth 2 (two) times daily as needed for muscle spasms.      No Known Allergies    The results of significant diagnostics from this hospitalization (including imaging, microbiology, ancillary and laboratory) are listed below for reference.    Significant Diagnostic Studies: DG Chest 1 View  Result Date: 09/08/2019 CLINICAL DATA:  74 year old male with history of acute respiratory  distress. Shortness of breath. EXAM: CHEST  1 VIEW COMPARISON:  Chest x-ray 09/07/2019. FINDINGS: Lung volumes are normal. No consolidative airspace disease. No pleural effusions. No pneumothorax. No pulmonary nodule or mass noted. Pulmonary vasculature and the cardiomediastinal silhouette are within normal limits. Atherosclerosis in the thoracic aorta. IMPRESSION: 1.  No radiographic evidence of acute cardiopulmonary disease. 2. Aortic atherosclerosis. Electronically Signed   By: Trudie Reed M.D.   On: 09/08/2019 10:57   CT Chest W Contrast  Result Date: 09/07/2019 CLINICAL DATA:  Abdominal abscess or infection suspected. Fever and weakness EXAM: CT CHEST, ABDOMEN, AND PELVIS WITH CONTRAST TECHNIQUE: Multidetector CT imaging of the chest, abdomen and pelvis was performed following the standard protocol during bolus administration of intravenous contrast. CONTRAST:  80mL OMNIPAQUE IOHEXOL 300 MG/ML  SOLN COMPARISON:  None. FINDINGS: CT CHEST FINDINGS Cardiovascular: Normal heart size. No pericardial effusion. Aortic and coronary atherosclerosis. Aortic valvular calcification. No acute vascular finding. Mediastinum/Nodes: Negative for adenopathy. Lungs/Pleura: There is no edema, consolidation, effusion, or pneumothorax. Musculoskeletal: Extensive disc degeneration. There are levels of endplate irregularity that is multilevel and degenerative appearing. No paravertebral inflammatory soft tissue findings. Postoperative right shoulder with rotator cuff atrophy. CT ABDOMEN PELVIS FINDINGS Hepatobiliary: No focal liver abnormality.No evidence of biliary obstruction or stone. Pancreas: Unremarkable. Spleen: Unremarkable. Adrenals/Urinary Tract: Negative adrenals. No hydronephrosis or stone. Unremarkable bladder. Stomach/Bowel:  No obstruction. No appendicitis. Vascular/Lymphatic: No acute vascular abnormality. Extensive atherosclerotic calcification. No mass or adenopathy. Reproductive:No pathologic findings.  Other: No ascites or pneumoperitoneum. Musculoskeletal: No acute abnormalities. Advanced lumbar spine degeneration with L4-5 anterolisthesis and high-grade canal/right foraminal stenosis. No evidence of bony infection. IMPRESSION: 1. No explanation for fever. No acute finding in the chest or abdomen. 2.  Aortic Atherosclerosis (ICD10-I70.0). Electronically Signed   By: Marnee Spring M.D.   On: 09/07/2019 09:25   US Abdomen Complete  Result Date: 09/10/2019 CLINICAL DATA:  Liver cirrhosis. EXAM: ABDOMEN ULTRASOUND COMPLETE COMPARISON:  CT 3 days ago FINDINGS: Gallbladder: Physiologically distended. No gallstones or wall thickening visualized. No sonographic Murphy sign noted by sonographer. Common bile duct: Diameter: 4 mm. Liver: Heterogeneously increased in parenchymal echogenicity. More hypoechoic area is primarily adjacent to the gallbladder fossa largest measuring 2 cm may represent focal fatty sparing. No definite capsular nodularity by ultrasound. Portal vein is patent on color Doppler imaging with normal direction of blood flow towards the liver. IVC: No abnormality visualized. Pancreas: Not well visualized due to overlying bowel gas. Spleen: Size and appearance within normal limits.  No splenomegaly. Right Kidney: Length: 12 cm. Echogenicity within  normal limits. No mass or hydronephrosis visualized. Left Kidney: Length: 11.3 cm. Echogenicity within normal limits. No mass or hydronephrosis visualized. Abdominal aorta: No aneurysm visualized. Other findings: No abdominal ascites or free fluid. IMPRESSION: 1. Heterogeneously increased hepatic echogenicity typical of steatosis. There are hypoechoic areas adjacent to the gallbladder fossa that likely represent focal fatty sparing. 2. No ascites or splenomegaly. Electronically Signed   By: Narda RutherfordMelanie  Sanford M.D.   On: 09/10/2019 22:19   CT Abdomen Pelvis W Contrast  Result Date: 09/07/2019 CLINICAL DATA:  Abdominal abscess or infection suspected. Fever  and weakness EXAM: CT CHEST, ABDOMEN, AND PELVIS WITH CONTRAST TECHNIQUE: Multidetector CT imaging of the chest, abdomen and pelvis was performed following the standard protocol during bolus administration of intravenous contrast. CONTRAST:  80mL OMNIPAQUE IOHEXOL 300 MG/ML  SOLN COMPARISON:  None. FINDINGS: CT CHEST FINDINGS Cardiovascular: Normal heart size. No pericardial effusion. Aortic and coronary atherosclerosis. Aortic valvular calcification. No acute vascular finding. Mediastinum/Nodes: Negative for adenopathy. Lungs/Pleura: There is no edema, consolidation, effusion, or pneumothorax. Musculoskeletal: Extensive disc degeneration. There are levels of endplate irregularity that is multilevel and degenerative appearing. No paravertebral inflammatory soft tissue findings. Postoperative right shoulder with rotator cuff atrophy. CT ABDOMEN PELVIS FINDINGS Hepatobiliary: No focal liver abnormality.No evidence of biliary obstruction or stone. Pancreas: Unremarkable. Spleen: Unremarkable. Adrenals/Urinary Tract: Negative adrenals. No hydronephrosis or stone. Unremarkable bladder. Stomach/Bowel:  No obstruction. No appendicitis. Vascular/Lymphatic: No acute vascular abnormality. Extensive atherosclerotic calcification. No mass or adenopathy. Reproductive:No pathologic findings. Other: No ascites or pneumoperitoneum. Musculoskeletal: No acute abnormalities. Advanced lumbar spine degeneration with L4-5 anterolisthesis and high-grade canal/right foraminal stenosis. No evidence of bony infection. IMPRESSION: 1. No explanation for fever. No acute finding in the chest or abdomen. 2.  Aortic Atherosclerosis (ICD10-I70.0). Electronically Signed   By: Marnee SpringJonathon  Watts M.D.   On: 09/07/2019 09:25   DG Chest Port 1 View  Result Date: 09/07/2019 CLINICAL DATA:  Fever and rigors EXAM: PORTABLE CHEST 1 VIEW COMPARISON:  None. FINDINGS: Normal heart size and mediastinal contours. Low volume chest without acute infiltrate or  edema. No effusion or pneumothorax. No acute osseous findings. IMPRESSION: No visible pneumonia. Electronically Signed   By: Marnee SpringJonathon  Watts M.D.   On: 09/07/2019 07:48    Microbiology: Recent Results (from the past 240 hour(s))  Urine culture     Status: Abnormal   Collection Time: 09/07/19  7:23 AM   Specimen: In/Out Cath Urine  Result Value Ref Range Status   Specimen Description   Final    IN/OUT CATH URINE Performed at [facility] [place], [address] [place], [place] [address]    Special Requests   Final    NONE Performed at [facility] [place], [address] [place], [place] [address]    Culture MULTIPLE SPECIES PRESENT, SUGGEST RECOLLECTION (A)  Final   Report Status 09/08/2019 FINAL  Final  Blood Culture (routine x 2)     Status: None (Preliminary result)   Collection Time: 09/07/19  7:28 AM   Specimen: BLOOD  Result Value Ref Range Status   Specimen Description   Final    BLOOD RIGHT ANTECUBITAL Performed at [facility] [place], [address] [place], [place] [address]    Special Requests   Final    BOTTLES DRAWN AEROBIC AND ANAEROBIC Blood Culture adequate volume Performed at [facility] [place], [address] [place], [place] [address]    Culture   Final    NO GROWTH  3 DAYS Performed at [facility], [address], [place], [place] [address]    Report Status PENDING  Incomplete  Blood Culture (routine x 2)     Status: None (Preliminary result)   Collection Time: 09/07/19  7:28 AM   Specimen: BLOOD  Result Value Ref Range Status   Specimen Description   Final    BLOOD BLOOD LEFT ARM Performed at [facility], [address] [place], [place] [address]    Special Requests   Final    BOTTLES DRAWN AEROBIC ONLY Blood Culture results may not be optimal due to an inadequate volume of blood received in culture bottles Performed at [facility], [address] [place], [place] [address]    Culture   Final     NO GROWTH 3 DAYS Performed at [facility], [address] [place], [place] [address]    Report Status PENDING  Incomplete  SARS Coronavirus 2 by RT PCR (hospital order, performed in [facility] hospital lab) Nasopharyngeal Peripheral     Status: None   Collection Time: 09/07/19  7:28 AM   Specimen: Peripheral; Nasopharyngeal  Result Value Ref Range Status   SARS Coronavirus 2 NEGATIVE NEGATIVE Final    Comment: (NOTE) SARS-CoV-2 target nucleic acids are NOT DETECTED.  The SARS-CoV-2 RNA is generally detectable in upper and lower respiratory specimens during the acute phase of infection. The lowest concentration of SARS-CoV-2 viral copies this assay can detect is 250 copies / mL. A negative result does not preclude SARS-CoV-2 infection and should not be used as the sole basis for treatment or other patient management decisions.  A negative result may occur with improper specimen collection / handling, submission of specimen other than nasopharyngeal swab, presence of viral mutation(s) within the areas targeted by this assay, and inadequate number of viral copies (<250 copies / mL). A negative result must be combined with clinical observations, patient history, and epidemiological information.  Fact Sheet for Patients:   BoilerBrush.com.cy  Fact Sheet for Healthcare Providers: https://pope.com/  This test is not yet approved or  cleared by the [place] FDA and has been authorized for detection and/or diagnosis of SARS-CoV-2 by FDA under an Emergency Use Authorization (EUA).  This EUA will remain in effect (meaning this test can be used) for the duration of the COVID-19 declaration under Section 564(b)(1) of the Act, 21 U.S.C. section 360bbb-3(b)(1), unless the authorization is terminated or revoked sooner.  Performed at [facility], [address] [place], [place] [address]   Culture, blood (single)      Status: None (Preliminary result)   Collection Time: 09/08/19  1:44 PM   Specimen: BLOOD  Result Value Ref Range Status   Specimen Description   Final    BLOOD LEFT ANTECUBITAL Performed at [facility], [address], [place], [place] [address]    Special Requests   Final    BOTTLES DRAWN AEROBIC AND ANAEROBIC Blood Culture adequate volume Performed at [facility], [address], [place], [place] [address]    Culture   Final    NO GROWTH 2 DAYS Performed at [facility], [address] [place], [place] [address]    Report Status PENDING  Incomplete     Labs: Basic Metabolic Panel: Recent Labs  Lab 09/07/19 0728 09/07/19 0728 09/07/19 1726 09/08/19 0323 09/09/19 0847 09/10/19 0911 09/11/19 0422  NA 139  --   --  134* 134* 135 135  K 3.6  --   --  3.9 4.0 4.4 4.3  CL 104  --   --  105 103 102 103  CO2 25  --   --  22 24 24 24   GLUCOSE 78  --   --  131* 176* 213* 199*  BUN 30*  --   --  23 22 17 20   CREATININE 1.67*   < > 1.53* 1.49* 1.43* 1.26* 1.35*  CALCIUM 9.2  --   --  8.2* 8.6* 9.0 9.0   < > = values in this interval not displayed.   Liver Function Tests: Recent Labs  Lab 09/07/19 0728 09/08/19 0323 09/09/19 0847 09/10/19 0911 09/11/19 0422  AST 26 24 147* 169* 82*  ALT 28 26 149* 250* 180*  ALKPHOS 53 39 40 46 42  BILITOT 0.7 0.8 0.8 0.8 0.5  PROT 7.5 6.6 6.6 7.3 6.5  ALBUMIN 4.0 3.2* 3.2* 3.3* 3.0*   No results for input(s): LIPASE, AMYLASE in the last 168 hours. No results for input(s): AMMONIA in the last 168 hours. CBC: Recent Labs  Lab 09/07/19 0728 09/07/19 0728 09/07/19 1726 09/08/19 0323 09/09/19 0847 09/10/19 0911 09/11/19 0422  WBC 11.2*   < > 20.6* 14.1* 7.0 5.6 6.3  NEUTROABS 9.3*  --   --   --  5.0 2.9 2.9  HGB 13.8   < > 13.3 12.4* 12.1* 14.2 12.7*  HCT 41.5   < > 39.2 36.4* 35.6* 42.1 37.5*  MCV 93.3   < > 91.6 93.1 91.3 92.3 91.9  PLT 204   < > 204 215 157 197 209   < > =  values in this interval not displayed.   Cardiac Enzymes: No results for input(s): CKTOTAL, CKMB, CKMBINDEX, TROPONINI in the last 168 hours. BNP: BNP (last 3 results) No results for input(s): BNP in the last 8760 hours.  ProBNP (last 3 results) No results for input(s): PROBNP in the last 8760 hours.  CBG: Recent Labs  Lab 09/10/19 0729 09/10/19 1140 09/10/19 1623 09/10/19 2044 09/11/19 0740  GLUCAP 206* 186* 174* 137* 185*       Signed:  11/11/19 MD   [facility] 09/11/2019, 10:51 AM

## 2019-09-12 LAB — CULTURE, BLOOD (ROUTINE X 2)
Culture: NO GROWTH
Culture: NO GROWTH
Special Requests: ADEQUATE

## 2019-09-13 LAB — CULTURE, BLOOD (SINGLE)
Culture: NO GROWTH
Special Requests: ADEQUATE

## 2021-12-24 IMAGING — DX DG CHEST 1V PORT
1 series · 1 of 1 positions shown · non-contrast
Comparison: None.

CLINICAL DATA: Fever and rigors

EXAM:
PORTABLE CHEST 1 VIEW

[chest ap]
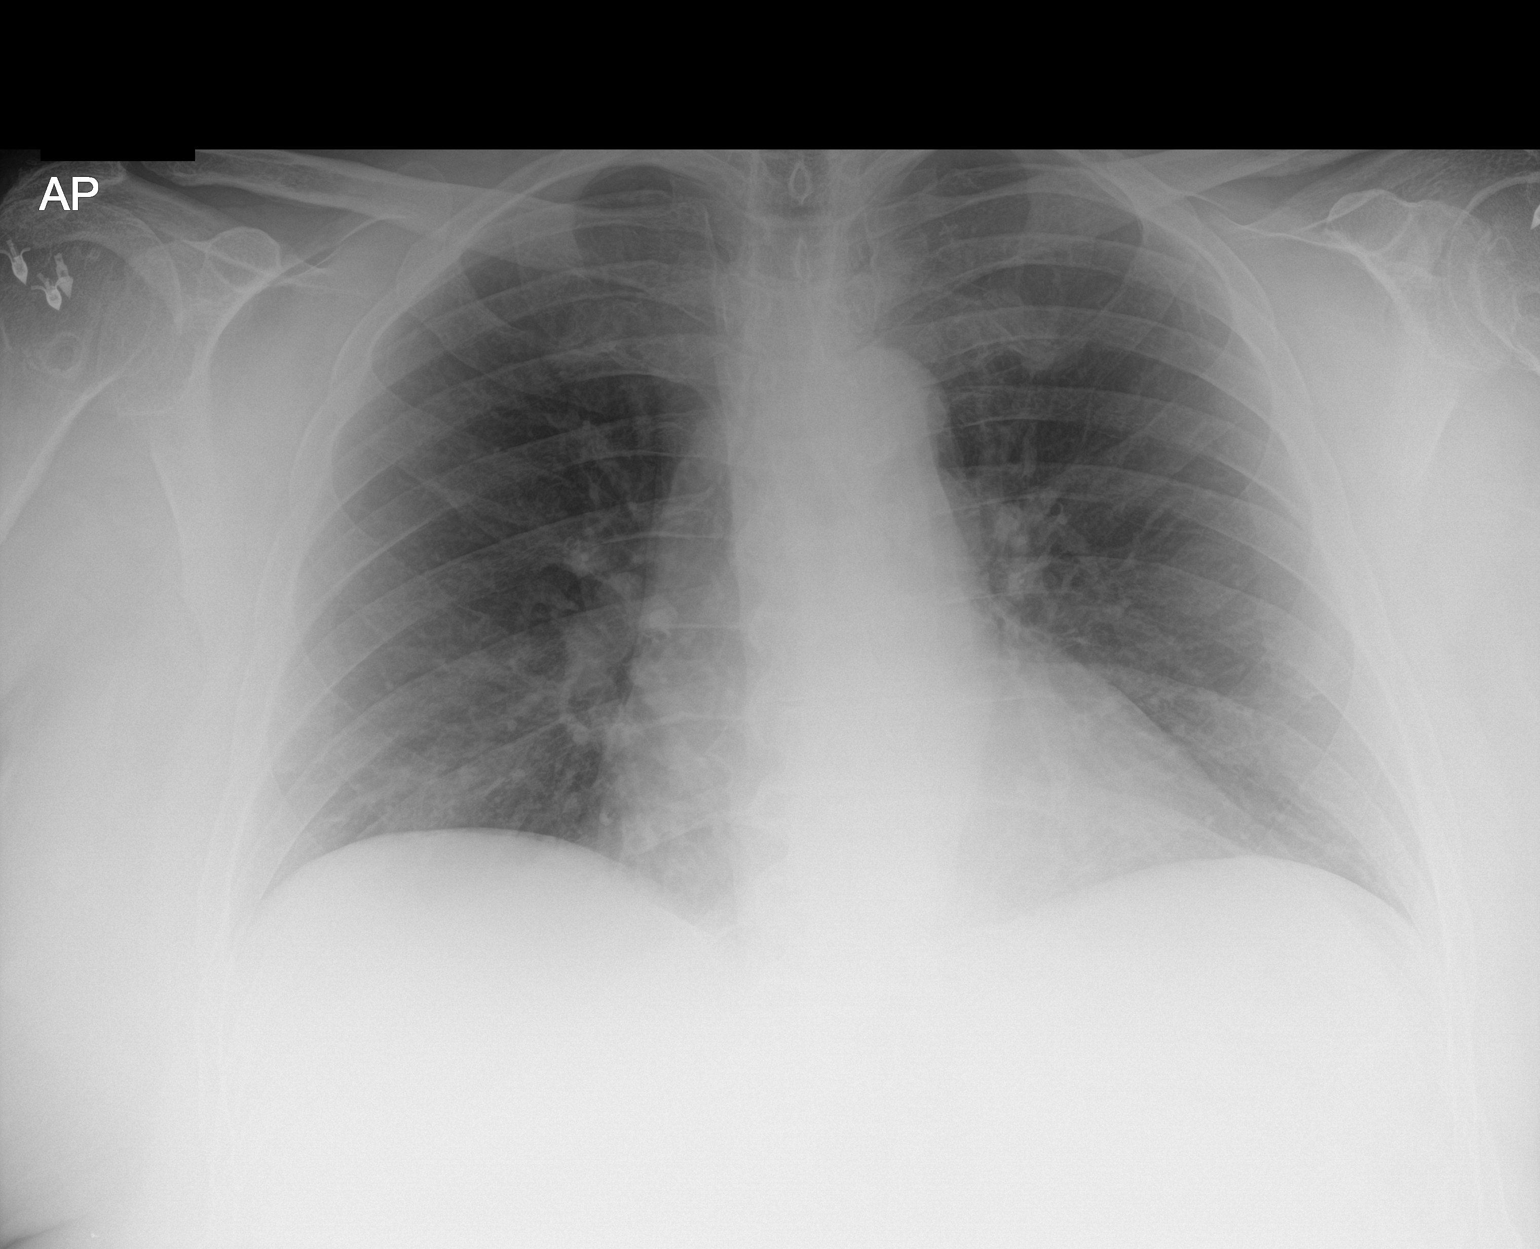

[1 of 1 positions shown; findings below may reference images not displayed]

FINDINGS: Normal heart size and mediastinal contours. Low volume chest without
acute infiltrate or edema. No effusion or pneumothorax. No acute
osseous findings.
IMPRESSION: No visible pneumonia.

## 2021-12-24 IMAGING — CT CT ABD-PELV W/ CM
2 of 5 series · 13 of 36 positions shown, 16 images · IV contrast (Omnipaque)
Comparison: None.

CLINICAL DATA: Abdominal abscess or infection suspected. Fever and
weakness

EXAM:
CT CHEST, ABDOMEN, AND PELVIS WITH CONTRAST
TECHNIQUE: Multidetector CT imaging of the chest, abdomen and pelvis was
performed following the standard protocol during bolus
administration of intravenous contrast.
CONTRAST:  80mL OMNIPAQUE IOHEXOL 300 MG/ML  SOLN

[Series 2: cap with 2 · axial · 0.97mm/px · z∈[-640,-90]mm · 10 of 136 slices shown, 13 images]
[im 13/136  mediastinal]
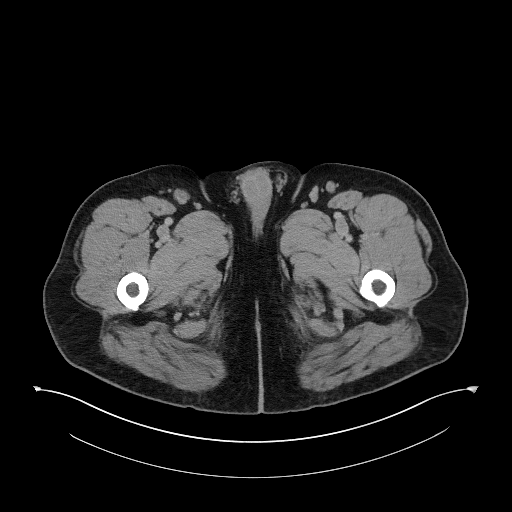
[im 13/136  lung]
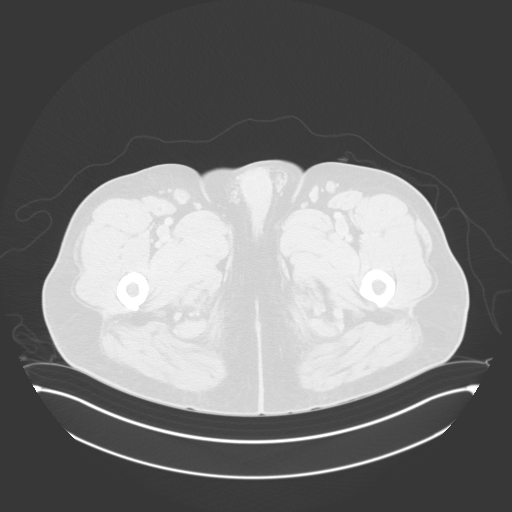
[im 25/136  lung]
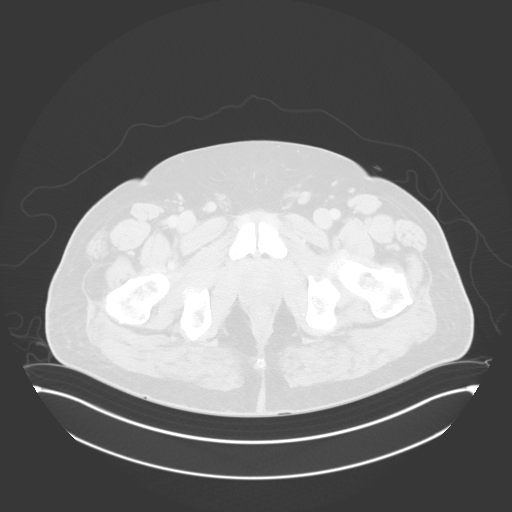
[im 37/136  lung]
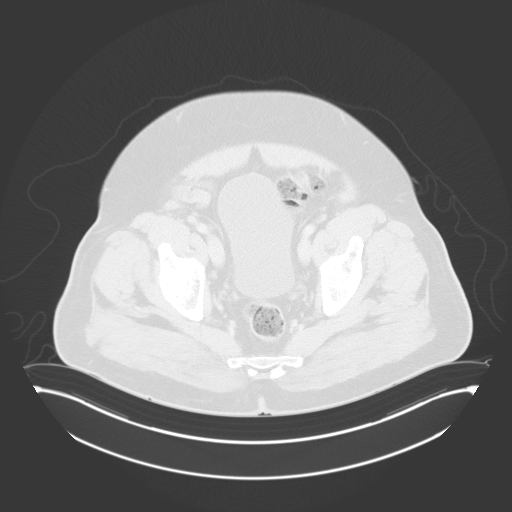
[im 50/136  lung]
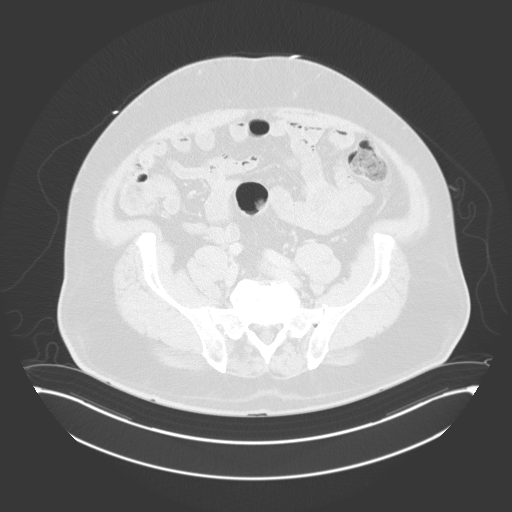
[im 62/136  mediastinal]
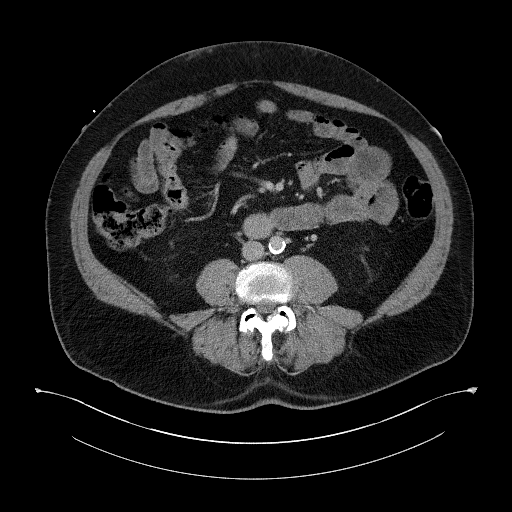
[im 62/136  lung]
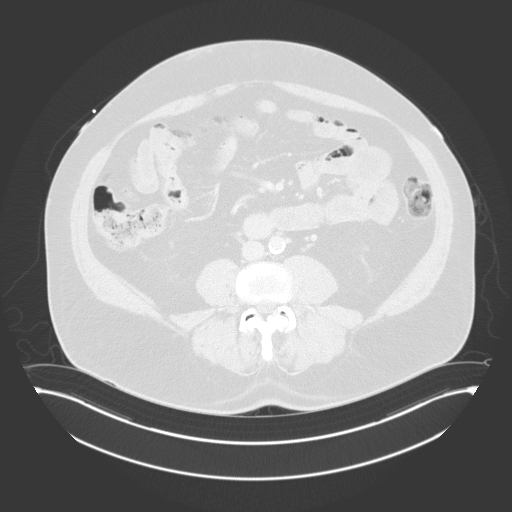
[im 74/136  lung]
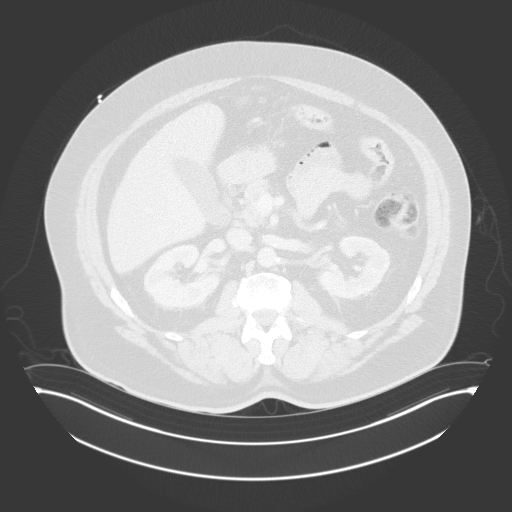
[im 86/136  lung]
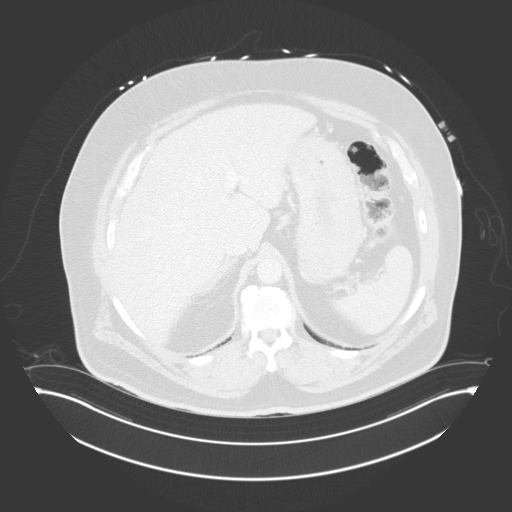
[im 99/136  lung]
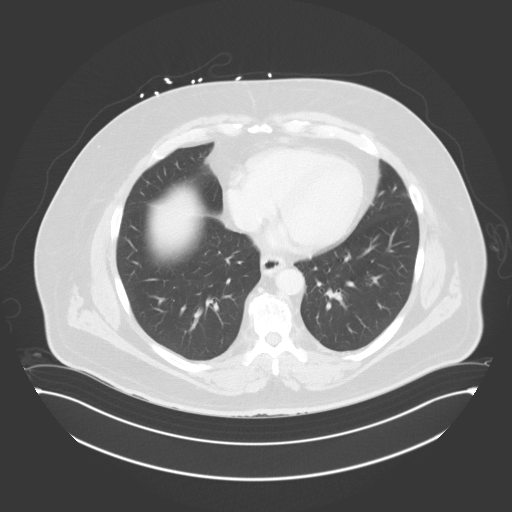
[im 111/136  mediastinal]
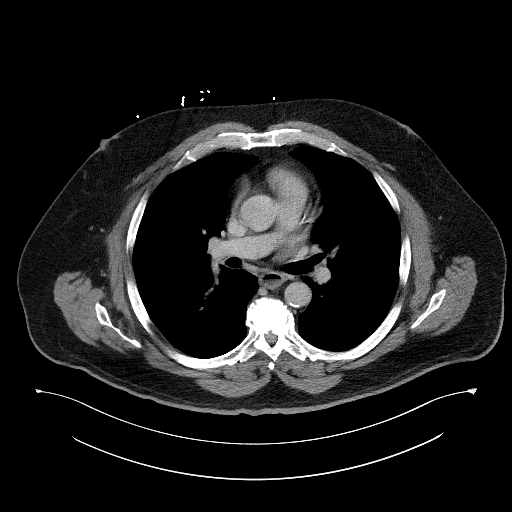
[im 111/136  lung]
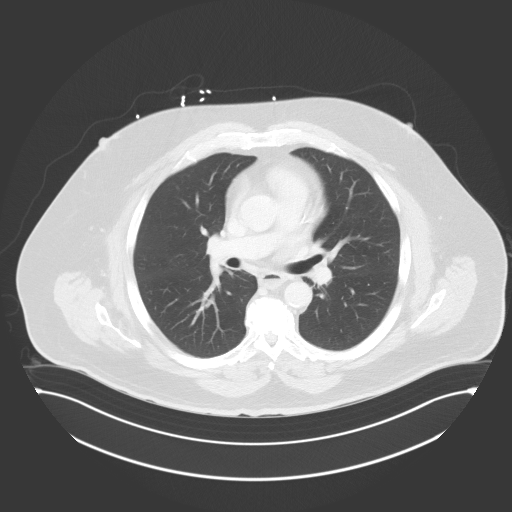
[im 123/136  lung]
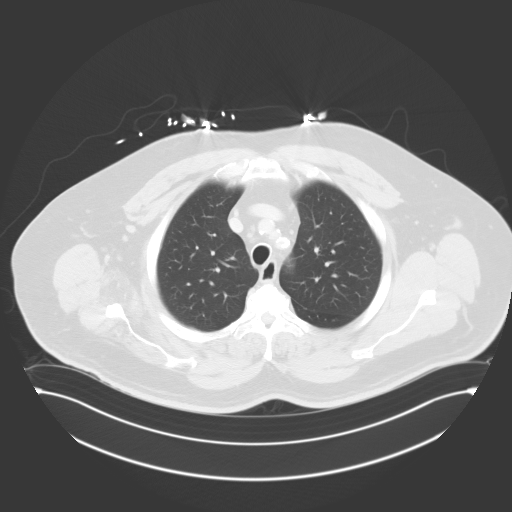

[Series 5: coronals · coronal · 0.88mm/px · 3 of 185 slices shown]
[im 37/185  lung]
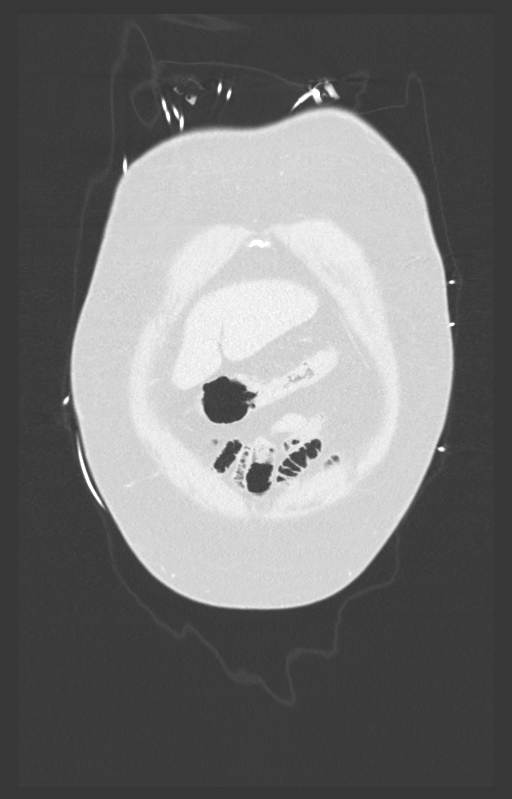
[im 74/185  lung]
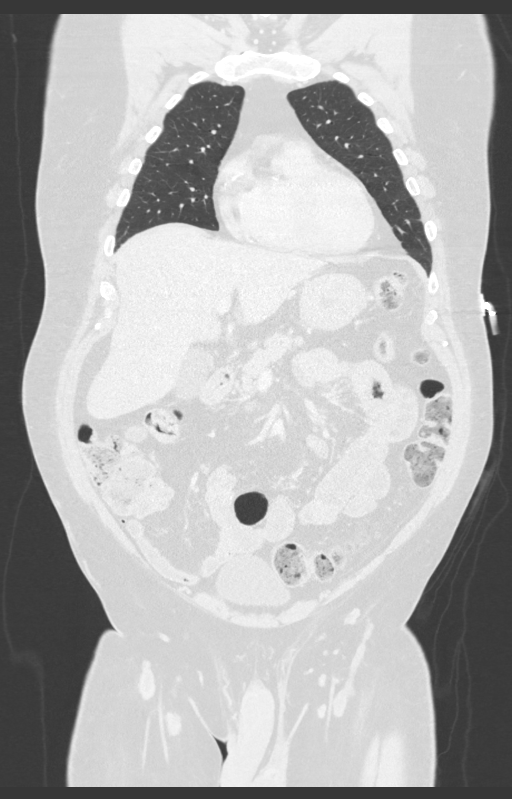
[im 111/185  lung]
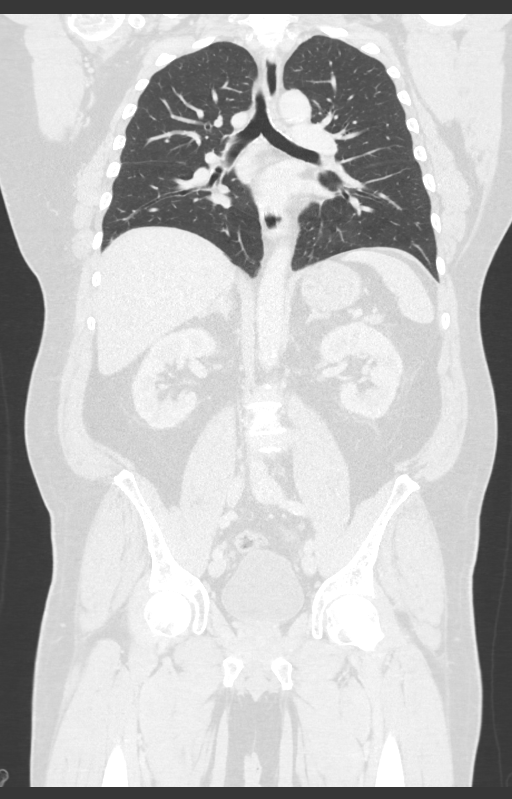

[13 of 36 positions shown; findings below may reference images not displayed]

FINDINGS: CT CHEST FINDINGS

Cardiovascular: Normal heart size. No pericardial effusion. Aortic
and coronary atherosclerosis. Aortic valvular calcification. No
acute vascular finding.

Mediastinum/Nodes: Negative for adenopathy.

Lungs/Pleura: There is no edema, consolidation, effusion, or
pneumothorax.

Musculoskeletal: Extensive disc degeneration. There are levels of
endplate irregularity that is multilevel and degenerative appearing.
No paravertebral inflammatory soft tissue findings. Postoperative
right shoulder with rotator cuff atrophy.

CT ABDOMEN PELVIS FINDINGS

Hepatobiliary: No focal liver abnormality.No evidence of biliary
obstruction or stone.

Pancreas: Unremarkable.

Spleen: Unremarkable.

Adrenals/Urinary Tract: Negative adrenals. No hydronephrosis or
stone. Unremarkable bladder.

Stomach/Bowel:  No obstruction. No appendicitis.

Vascular/Lymphatic: No acute vascular abnormality. Extensive
atherosclerotic calcification. No mass or adenopathy.

Reproductive:No pathologic findings.

Other: No ascites or pneumoperitoneum.

Musculoskeletal: No acute abnormalities. Advanced lumbar spine
degeneration with L4-5 anterolisthesis and high-grade canal/right
foraminal stenosis. No evidence of bony infection.
IMPRESSION: 1. No explanation for fever. No acute finding in the chest or
abdomen.
2.  Aortic Atherosclerosis ([id number]).

## 2021-12-27 IMAGING — US US ABDOMEN COMPLETE
1 series · 13 of 25 positions shown · non-contrast
Comparison: CT 3 days ago

CLINICAL DATA: Liver cirrhosis.

EXAM:
ABDOMEN ULTRASOUND COMPLETE

[Series 1: us abdomen complete · 13 of 143 slices shown]
[im 1/143]
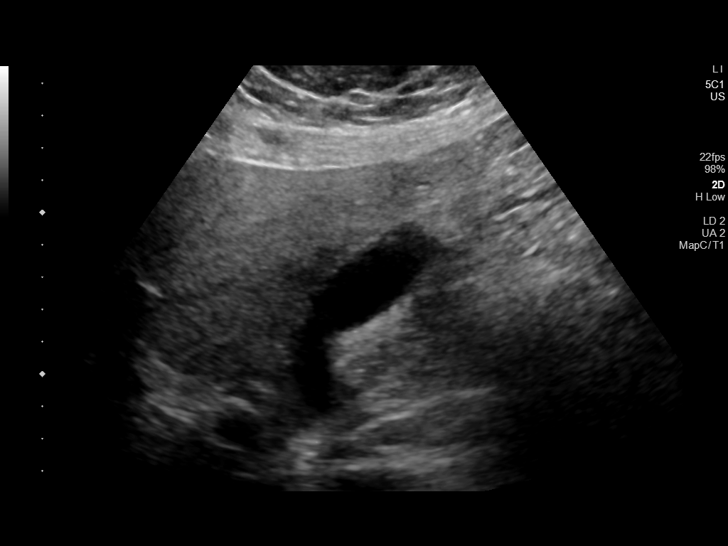
[im 12/143]
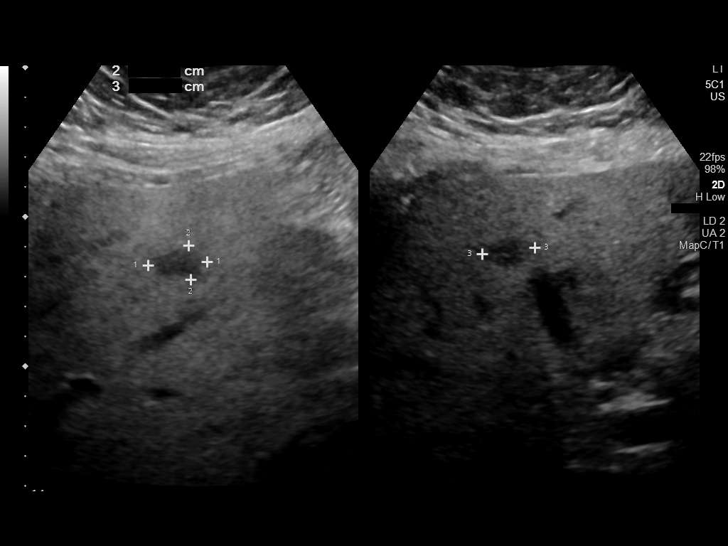
[im 24/143]
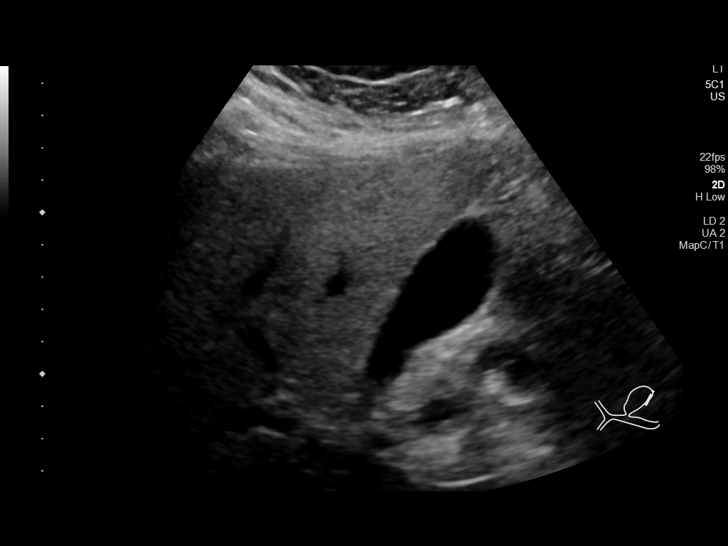
[im 36/143]
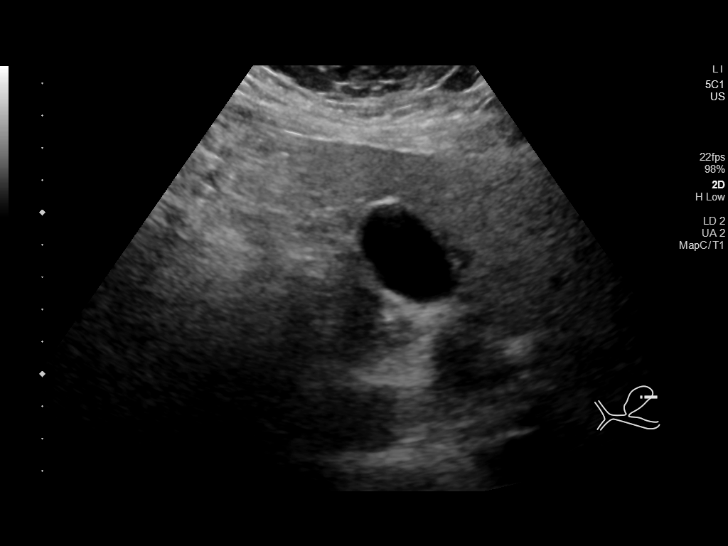
[im 48/143]
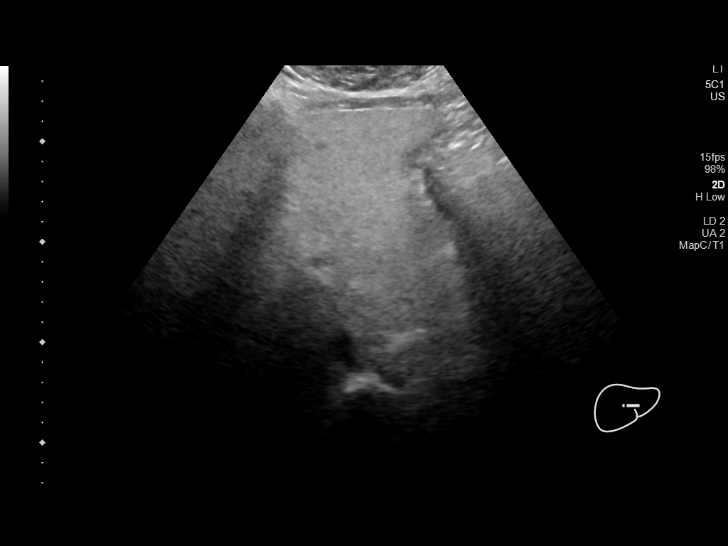
[im 60/143]
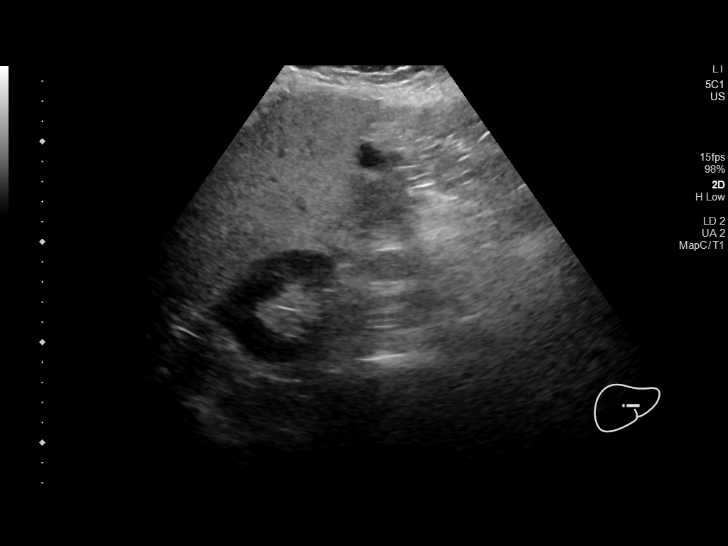
[im 72/143]
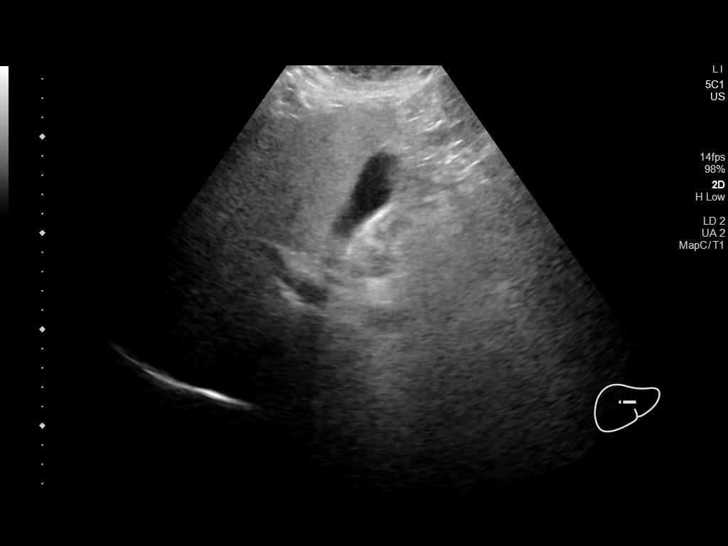
[im 83/143]
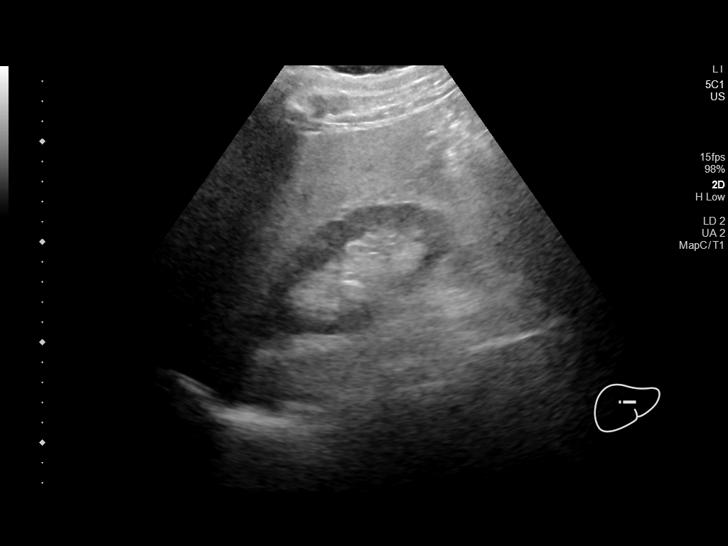
[im 95/143]
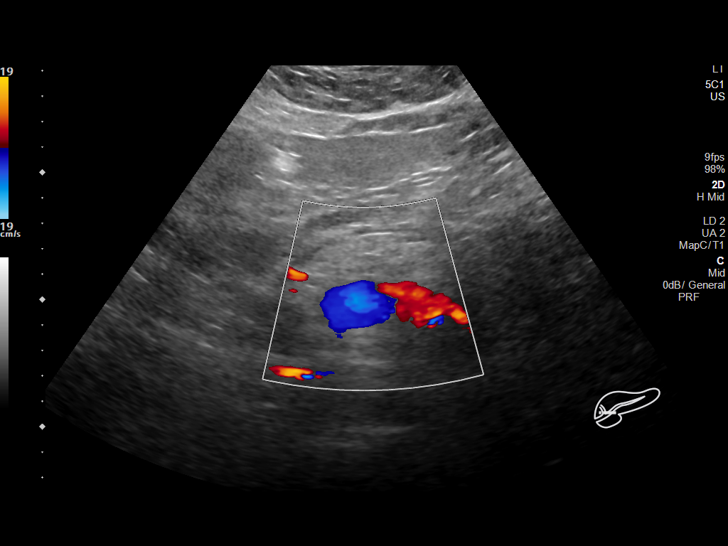
[im 107/143]
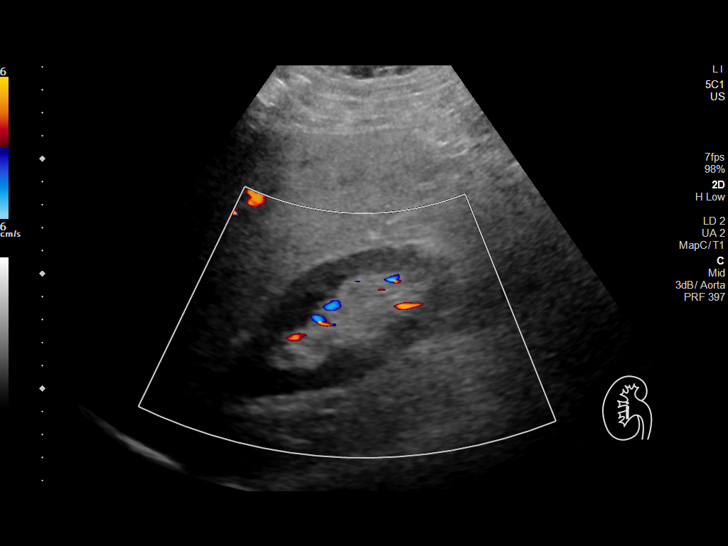
[im 119/143]
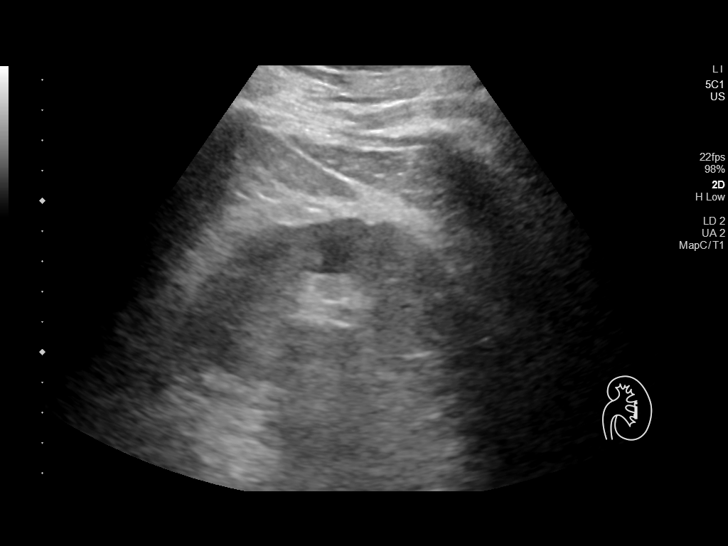
[im 131/143]
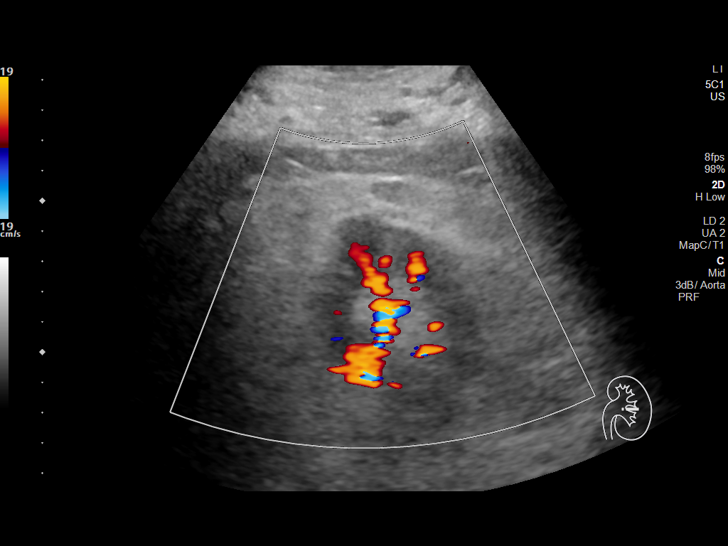
[im 143/143]
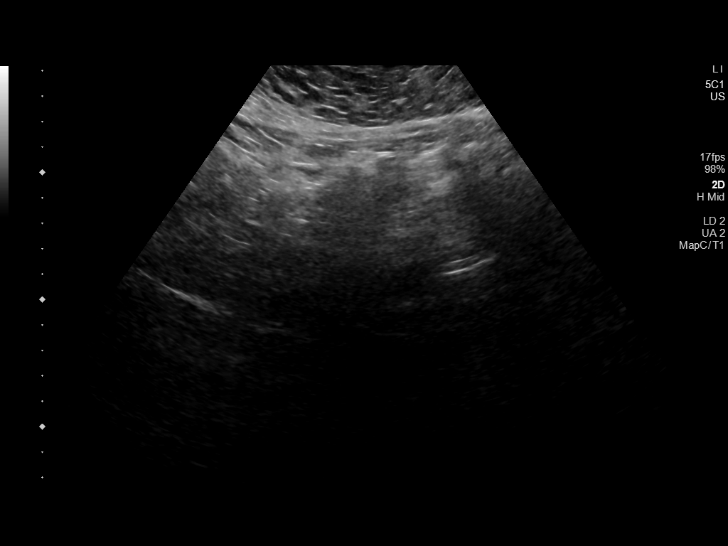

[13 of 25 positions shown; findings below may reference images not displayed]

FINDINGS: Gallbladder: Physiologically distended. No gallstones or wall
thickening visualized. No sonographic Murphy sign noted by
sonographer.

Common bile duct: Diameter: 4 mm.

Liver: Heterogeneously increased in parenchymal echogenicity. More
hypoechoic area is primarily adjacent to the gallbladder fossa
largest measuring 2 cm may represent focal fatty sparing. No
definite capsular nodularity by ultrasound. Portal vein is patent on
color Doppler imaging with normal direction of blood flow towards
the liver.

IVC: No abnormality visualized.

Pancreas: Not well visualized due to overlying bowel gas.

Spleen: Size and appearance within normal limits.  No splenomegaly.

Right Kidney: Length: 12 cm. Echogenicity within normal limits. No
mass or hydronephrosis visualized.

Left Kidney: Length: 11.3 cm. Echogenicity within normal limits. No
mass or hydronephrosis visualized.

Abdominal aorta: No aneurysm visualized.

Other findings: No abdominal ascites or free fluid.
IMPRESSION: 1. Heterogeneously increased hepatic echogenicity typical of
steatosis. There are hypoechoic areas adjacent to the gallbladder
fossa that likely represent focal fatty sparing.
2. No ascites or splenomegaly.
